# Patient Record
Sex: Male | Born: 1955
Health system: Southern US, Community
[De-identification: ages and names within clinical notes are randomized; demographics above are authoritative.]

## PROBLEM LIST (undated history)

## (undated) DIAGNOSIS — J358 Other chronic diseases of tonsils and adenoids: Secondary | ICD-10-CM

## (undated) DIAGNOSIS — E785 Hyperlipidemia, unspecified: Secondary | ICD-10-CM

## (undated) HISTORY — DX: Other chronic diseases of tonsils and adenoids: J35.8

## (undated) HISTORY — DX: Hyperlipidemia, unspecified: E78.5

## (undated) HISTORY — PX: VASECTOMY: SHX75

## (undated) HISTORY — PX: EYE SURGERY: SHX253

---

## 2006-08-08 ENCOUNTER — Ambulatory Visit: Payer: Self-pay | Admitting: Internal Medicine

## 2006-08-08 DIAGNOSIS — R1013 Epigastric pain: Secondary | ICD-10-CM

## 2006-08-08 DIAGNOSIS — G4700 Insomnia, unspecified: Secondary | ICD-10-CM | POA: Insufficient documentation

## 2006-08-08 DIAGNOSIS — K3189 Other diseases of stomach and duodenum: Secondary | ICD-10-CM | POA: Insufficient documentation

## 2006-08-08 LAB — CONVERTED CEMR LAB
ALT: 24 units/L (ref 0–40)
AST: 20 units/L (ref 0–37)
Albumin: 4.2 g/dL (ref 3.5–5.2)
Alkaline Phosphatase: 60 units/L (ref 39–117)
BUN: 9 mg/dL (ref 6–23)
Bilirubin, Direct: 0.1 mg/dL (ref 0.0–0.3)
CO2: 33 meq/L — ABNORMAL HIGH (ref 19–32)
Calcium: 9.3 mg/dL (ref 8.4–10.5)
Chloride: 105 meq/L (ref 96–112)
Creatinine, Ser: 0.8 mg/dL (ref 0.4–1.5)
GFR calc Af Amer: 132 mL/min
GFR calc non Af Amer: 109 mL/min
Glucose, Bld: 90 mg/dL (ref 70–99)
Potassium: 3.7 meq/L (ref 3.5–5.1)
Sodium: 142 meq/L (ref 135–145)
TSH: 2.47 microintl units/mL (ref 0.35–5.50)
Total Bilirubin: 0.9 mg/dL (ref 0.3–1.2)
Total Protein: 6.6 g/dL (ref 6.0–8.3)

## 2006-09-12 ENCOUNTER — Ambulatory Visit: Payer: Self-pay | Admitting: Internal Medicine

## 2007-06-13 ENCOUNTER — Ambulatory Visit: Payer: Self-pay | Admitting: Internal Medicine

## 2007-07-15 ENCOUNTER — Ambulatory Visit: Payer: Self-pay | Admitting: Internal Medicine

## 2008-06-18 ENCOUNTER — Ambulatory Visit: Payer: Self-pay | Admitting: Internal Medicine

## 2008-06-18 LAB — CONVERTED CEMR LAB
ALT: 22 units/L (ref 0–53)
AST: 21 units/L (ref 0–37)
Albumin: 4.1 g/dL (ref 3.5–5.2)
Alkaline Phosphatase: 51 units/L (ref 39–117)
BUN: 16 mg/dL (ref 6–23)
Basophils Relative: 0.6 % (ref 0.0–3.0)
Bilirubin Urine: NEGATIVE
Chloride: 106 meq/L (ref 96–112)
Cholesterol: 192 mg/dL (ref 0–200)
Creatinine, Ser: 0.9 mg/dL (ref 0.4–1.5)
Eosinophils Relative: 5.6 % — ABNORMAL HIGH (ref 0.0–5.0)
Hemoglobin, Urine: NEGATIVE
Hemoglobin: 15.4 g/dL (ref 13.0–17.0)
LDL Cholesterol: 133 mg/dL — ABNORMAL HIGH (ref 0–99)
Lymphocytes Relative: 27.5 % (ref 12.0–46.0)
MCHC: 35 g/dL (ref 30.0–36.0)
Monocytes Relative: 11 % (ref 3.0–12.0)
Neutro Abs: 2.5 10*3/uL (ref 1.4–7.7)
RBC: 4.61 M/uL (ref 4.22–5.81)
Total CHOL/HDL Ratio: 5
Total Protein, Urine: NEGATIVE mg/dL
Total Protein: 6.6 g/dL (ref 6.0–8.3)
Urine Glucose: NEGATIVE mg/dL
WBC: 4.6 10*3/uL (ref 4.5–10.5)
pH: 7.5 (ref 5.0–8.0)

## 2008-07-08 ENCOUNTER — Ambulatory Visit: Payer: Self-pay | Admitting: Internal Medicine

## 2008-08-11 ENCOUNTER — Ambulatory Visit: Payer: Self-pay | Admitting: Gastroenterology

## 2008-08-25 ENCOUNTER — Encounter: Payer: Self-pay | Admitting: Internal Medicine

## 2009-09-24 ENCOUNTER — Ambulatory Visit: Payer: Self-pay | Admitting: Internal Medicine

## 2009-09-24 LAB — CONVERTED CEMR LAB
Albumin: 4.2 g/dL (ref 3.5–5.2)
Alkaline Phosphatase: 62 units/L (ref 39–117)
Basophils Absolute: 0.1 10*3/uL (ref 0.0–0.1)
Bilirubin Urine: NEGATIVE
CO2: 34 meq/L — ABNORMAL HIGH (ref 19–32)
Calcium: 9.4 mg/dL (ref 8.4–10.5)
Chloride: 107 meq/L (ref 96–112)
Cholesterol: 205 mg/dL — ABNORMAL HIGH (ref 0–200)
Creatinine, Ser: 0.9 mg/dL (ref 0.4–1.5)
Eosinophils Absolute: 0.3 10*3/uL (ref 0.0–0.7)
Glucose, Bld: 87 mg/dL (ref 70–99)
HDL: 43.5 mg/dL (ref >39.00)
Hemoglobin, Urine: NEGATIVE
Hemoglobin: 15.6 g/dL (ref 13.0–17.0)
Ketones, ur: NEGATIVE mg/dL
Lymphocytes Relative: 30.6 % (ref 12.0–46.0)
MCHC: 34.6 g/dL (ref 30.0–36.0)
MCV: 97.7 fL (ref 78.0–100.0)
Monocytes Absolute: 0.5 10*3/uL (ref 0.1–1.0)
Neutro Abs: 2.7 10*3/uL (ref 1.4–7.7)
RDW: 13.2 % (ref 11.5–14.6)
TSH: 2.61 microintl units/mL (ref 0.35–5.50)
Total Protein, Urine: NEGATIVE mg/dL
Total Protein: 7 g/dL (ref 6.0–8.3)
Triglycerides: 129 mg/dL (ref 0.0–149.0)
Urine Glucose: NEGATIVE mg/dL
pH: 5.5 (ref 5.0–8.0)

## 2009-10-01 ENCOUNTER — Ambulatory Visit: Payer: Self-pay | Admitting: Internal Medicine

## 2010-04-13 NOTE — Assessment & Plan Note (Signed)
Summary: CPX//CCM   Vital Signs:  Patient profile:   55 year old male Height:      67.75 inches Weight:      150 pounds BMI:     23.06 Pulse rate:   64 / minute Pulse rhythm:   regular Resp:     12 per minute BP sitting:   106 / 76  (left arm) Cuff size:   regular  Vitals Entered By: Gladis Riffle, RN (October 01, 2009 8:12 AM) CC: cpx, labs done Is Patient Diabetic? No   CC:  cpx and labs done.  Preventive Screening-Counseling & Management  Alcohol-Tobacco     Smoking Status: never  Current Problems (verified): 1)  Preventive Health Care  (ICD-V70.0) 2)  Special Screening For Malignant Neoplasms Colon  (ICD-V76.51) 3)  Dyspepsia  (ICD-536.8) 4)  Insomnia  (ICD-780.52) 5)  Family History Breast Cancer 1st Degree Relative <50  (ICD-V16.3)  Current Medications (verified): 1)  None  Allergies (verified): No Known Drug Allergies  Past History:  Past Medical History: Last updated: 2006/09/04 Unremarkable  Past Surgical History: Last updated: 07/08/2008 Vasectomy colonoscopy (2005)  Family History: Last updated: 09/04/06 father deceased-hx of liver cancer died from MI age 4.  mother alive and well (breast CA) Family History Breast cancer 1st degree relative <50  Social History: Last updated: 06/13/2007 Regular exercise-yes-runs regularly moved from [facility] 7/07 Quit caffeine, no ETOH  Occupation:quality Art gallery manager for [employer] Married Patient never smoked.   Risk Factors: Exercise: yes (09/04/06)  Risk Factors: Smoking Status: never (10/01/2009)  Physical Exam  General:  alert and well-developed.   Head:  normocephalic and atraumatic.   Eyes:  pupils equal and pupils round.   Ears:  R ear normal and L ear normal.   Neck:  no JVD.  Thyroid not enlarged Lungs:  clear bilaterally to auscultation and percussion Heart:  normal rate and regular rhythm.   Abdomen:  Bowel sounds positive,abdomen soft and non-tender without masses, organomegaly or hernias  noted. Msk:  No deformity or scoliosis noted of thoracic or lumbar spine.   Extremities:  No clubbing, cyanosis, edema, or deformity noted  Neurologic:  cranial nerves II-XII intact and gait normal.     Impression & Recommendations:  Problem # 1:  PREVENTIVE HEALTH CARE (ICD-V70.0) health maint UTD encouraged continued excellent health habits cpx in two years  Preventive Care Screening  Colonoscopy:    Date:  08/25/2008    Next Due:  08/2018    Results:  normal     Preventive Care Screening  Colonoscopy:    Date:  08/25/2008    Next Due:  08/2018    Results:  normal

## 2011-10-16 ENCOUNTER — Other Ambulatory Visit (INDEPENDENT_AMBULATORY_CARE_PROVIDER_SITE_OTHER): Payer: BC Managed Care – PPO

## 2011-10-16 ENCOUNTER — Ambulatory Visit (INDEPENDENT_AMBULATORY_CARE_PROVIDER_SITE_OTHER): Payer: BC Managed Care – PPO | Admitting: Family Medicine

## 2011-10-16 ENCOUNTER — Encounter: Payer: Self-pay | Admitting: Family Medicine

## 2011-10-16 VITALS — BP 124/84 | Temp 97.7°F | Wt 150.0 lb

## 2011-10-16 DIAGNOSIS — Z0000 Encounter for general adult medical examination without abnormal findings: Secondary | ICD-10-CM

## 2011-10-16 DIAGNOSIS — L259 Unspecified contact dermatitis, unspecified cause: Secondary | ICD-10-CM

## 2011-10-16 LAB — CBC WITH DIFFERENTIAL/PLATELET
Basophils Absolute: 0.1 10*3/uL (ref 0.0–0.1)
HCT: 44.9 % (ref 39.0–52.0)
Hemoglobin: 14.9 g/dL (ref 13.0–17.0)
Lymphs Abs: 1.5 10*3/uL (ref 0.7–4.0)
MCV: 98.2 fl (ref 78.0–100.0)
Monocytes Absolute: 0.4 10*3/uL (ref 0.1–1.0)
Monocytes Relative: 9.6 % (ref 3.0–12.0)
Neutro Abs: 2.4 10*3/uL (ref 1.4–7.7)
Platelets: 233 10*3/uL (ref 150.0–400.0)
RDW: 13.1 % (ref 11.5–14.6)

## 2011-10-16 LAB — POCT URINALYSIS DIPSTICK
Bilirubin, UA: NEGATIVE
Ketones, UA: NEGATIVE
Leukocytes, UA: NEGATIVE
Spec Grav, UA: <=1.005

## 2011-10-16 LAB — BASIC METABOLIC PANEL
BUN: 14 mg/dL (ref 6–23)
CO2: 32 mEq/L (ref 19–32)
Chloride: 103 mEq/L (ref 96–112)
GFR: 95.6 mL/min (ref >60.00)
Glucose, Bld: 95 mg/dL (ref 70–99)
Potassium: 5.3 mEq/L — ABNORMAL HIGH (ref 3.5–5.1)
Sodium: 140 mEq/L (ref 135–145)

## 2011-10-16 LAB — LIPID PANEL: Cholesterol: 191 mg/dL (ref 0–200)

## 2011-10-16 LAB — HEPATIC FUNCTION PANEL
ALT: 28 U/L (ref 0–53)
AST: 24 U/L (ref 0–37)
Albumin: 4.2 g/dL (ref 3.5–5.2)
Total Bilirubin: 0.8 mg/dL (ref 0.3–1.2)

## 2011-10-16 LAB — PSA: PSA: 1.38 ng/mL (ref 0.10–4.00)

## 2011-10-16 LAB — TSH: TSH: 2.58 u[IU]/mL (ref 0.35–5.50)

## 2011-10-16 MED ORDER — PREDNISONE 10 MG PO TABS: ORAL_TABLET | ORAL | Status: DC

## 2011-10-16 MED ORDER — METHYLPREDNISOLONE ACETATE 80 MG/ML IJ SUSP
80.0000 mg | Freq: Once | INTRAMUSCULAR | Status: AC
  Administered 2011-10-16: 80 mg via INTRAMUSCULAR

## 2011-10-16 NOTE — Progress Notes (Signed)
  Subjective:    Patient ID: Ronnie Hawkins, male    DOB: 1955/04/09, 56 y.o.   MRN: [id number]  HPI  Pruritic rash mostly below left eye. Started last week. No visual difficulties. Has had similar issues with contact dermatitis previously. He has a little involvement neck, right hand, and right side of face as well. No alleviating factors.   Review of Systems  Constitutional: Negative for fever and chills.  Eyes: Negative for visual disturbance.  Skin: Positive for rash.       Objective:   Physical Exam  Constitutional: He appears well-developed and well-nourished.  Eyes: Pupils are equal, round, and reactive to light.  Cardiovascular: Normal rate and regular rhythm.   Skin:       Patient has rash compatible with contact dermatitis below the left eyelid. Slightly raised minimally erythematous. No cellulitis changes.          Assessment & Plan:  Contact dermatitis. Discussed options with patient. Depo-Medrol 80 mg IM given. Oral prednisone taper to take if he is not seeing resolution with Depo-Medrol. He has tolerated Depo-Medrol previously.

## 2011-10-16 NOTE — Patient Instructions (Addendum)

## 2011-10-23 ENCOUNTER — Ambulatory Visit (INDEPENDENT_AMBULATORY_CARE_PROVIDER_SITE_OTHER): Payer: BC Managed Care – PPO | Admitting: Internal Medicine

## 2011-10-23 ENCOUNTER — Encounter: Payer: Self-pay | Admitting: Internal Medicine

## 2011-10-23 VITALS — BP 112/80 | HR 60 | Temp 98.0°F | Ht 67.5 in | Wt 149.0 lb

## 2011-10-23 DIAGNOSIS — Z0000 Encounter for general adult medical examination without abnormal findings: Secondary | ICD-10-CM

## 2011-10-25 NOTE — Progress Notes (Signed)
Patient ID: Ronnie Hawkins, male   DOB: [birth year], 56 y.o.   MRN: [id number] cpx  History reviewed. No pertinent past medical history.  History   Social History  . Marital Status: Married    Spouse Name: N/A    Number of Children: N/A  . Years of Education: N/A   Occupational History  . Not on file.   Social History Main Topics  . Smoking status: Never Smoker   . Smokeless tobacco: Not on file  . Alcohol Use: No  . Drug Use: Not on file  . Sexually Active: Not on file   Other Topics Concern  . Not on file   Social History Narrative  . No narrative on file    History reviewed. No pertinent past surgical history.  Family History  Problem Relation Age of Onset  . Cancer Mother     breast CA  . Diabetes Mother   . Cancer Father 109    liver- alcohol related  . Heart disease Father 55    dm    No Known Allergies  No current outpatient prescriptions on file prior to visit.     patient denies chest pain, shortness of breath, orthopnea. Denies lower extremity edema, abdominal pain, change in appetite, change in bowel movements. Patient denies rashes, musculoskeletal complaints. No other specific complaints in a complete review of systems.   BP 112/80  Pulse 60  Temp 98 F (36.7 C) (Oral)  Ht 5' 7.5" (1.715 m)  Wt 149 lb (67.586 kg)  BMI 22.99 kg/m2 Well-developed male in no acute distress. HEENT exam atraumatic, normocephalic, extraocular muscles are intact. Conjunctivae are pink without exudate. Neck is supple without lymphadenopathy, thyromegaly, jugular venous distention. Chest is clear to auscultation without increased work of breathing. Cardiac exam S1-S2 are regular. The PMI is normal. No significant murmurs or gallops. Abdominal exam active bowel sounds, soft, nontender. No abdominal bruits. Extremities no clubbing cyanosis or edema. Peripheral pulses are normal without bruits. Neurologic exam alert and oriented without any motor or sensory deficits. Rectal exam  normal tone prostate normal size without masses or asymmetry.   A/P- well visit- health maint utd

## 2013-04-10 ENCOUNTER — Ambulatory Visit (INDEPENDENT_AMBULATORY_CARE_PROVIDER_SITE_OTHER): Payer: BC Managed Care – PPO | Admitting: Family Medicine

## 2013-04-10 ENCOUNTER — Encounter: Payer: Self-pay | Admitting: Family Medicine

## 2013-04-10 VITALS — BP 116/80 | HR 69 | Temp 97.9°F | Wt 154.0 lb

## 2013-04-10 DIAGNOSIS — J358 Other chronic diseases of tonsils and adenoids: Secondary | ICD-10-CM

## 2013-04-10 DIAGNOSIS — R220 Localized swelling, mass and lump, head: Secondary | ICD-10-CM

## 2013-04-10 DIAGNOSIS — J029 Acute pharyngitis, unspecified: Secondary | ICD-10-CM

## 2013-04-10 DIAGNOSIS — R221 Localized swelling, mass and lump, neck: Secondary | ICD-10-CM

## 2013-04-10 LAB — POCT RAPID STREP A (OFFICE): RAPID STREP A SCREEN: NEGATIVE

## 2013-04-10 NOTE — Patient Instructions (Signed)

## 2013-04-10 NOTE — Progress Notes (Signed)
Pre visit review using our clinic review tool, if applicable. No additional management support is needed unless otherwise documented below in the visit note. 

## 2013-04-10 NOTE — Progress Notes (Signed)
   Subjective:    Patient ID: Ronnie Hawkins, male    DOB: [birth year], 58 y.o.   MRN: [id number]  HPI Acute visit for sore throat, body aches, chest congestion. Onset a few days ago. No fever. Sore throat symptoms are mild to moderate. Cough is relatively mild and nonproductive. No wheezing. Minimal nasal congestion. He has had no nausea or vomiting. Good fluid intake. Not taking any over-the-counter medications.  He has no chronic medical problems. Takes no regular medications. No known drug allergies. Denies any recent difficulty swallowing.  No past medical history on file. No past surgical history on file.  reports that he has never smoked. He does not have any smokeless tobacco history on file. He reports that he does not drink alcohol. His drug history is not on file. family history includes Cancer in his mother; Cancer (age of onset: 27) in his father; Diabetes in his mother; Heart disease (age of onset: 38) in his father. No Known Allergies    Review of Systems  Constitutional: Negative for fever, chills, appetite change and unexpected weight change.  HENT: Positive for sore throat. Negative for ear pain, sinus pressure and trouble swallowing.   Respiratory: Positive for cough.   Hematological: Negative for adenopathy. Does not bruise/bleed easily.       Objective:   Physical Exam  Constitutional: He appears well-developed.  HENT:  Right Ear: External ear normal.  Left Ear: External ear normal.  Nose: Nose normal.  Posterior pharynx no erythema. No exudate. Left tonsillar region small polypoid type mass which is very rounded and symmetric and non-ulcerative  Neck: Neck supple.  Cardiovascular: Normal rate.   Pulmonary/Chest: Effort normal and breath sounds normal. No respiratory distress. He has no wheezes. He has no rales.  Lymphadenopathy:    He has no cervical adenopathy.          Assessment & Plan:  #1 viral URI. Reassurance. Over-the-counter medications as  needed #2 probable benign polypoid type mass left tonsillar region. He has not had any adenopathy or other worrisome features but will have ENT evaluation

## 2013-05-20 ENCOUNTER — Other Ambulatory Visit (INDEPENDENT_AMBULATORY_CARE_PROVIDER_SITE_OTHER): Payer: BC Managed Care – PPO

## 2013-05-20 DIAGNOSIS — Z0000 Encounter for general adult medical examination without abnormal findings: Secondary | ICD-10-CM

## 2013-05-20 LAB — CBC WITH DIFFERENTIAL/PLATELET
BASOS PCT: 0.6 % (ref 0.0–3.0)
Basophils Absolute: 0 10*3/uL (ref 0.0–0.1)
EOS PCT: 4.9 % (ref 0.0–5.0)
Eosinophils Absolute: 0.2 10*3/uL (ref 0.0–0.7)
HEMATOCRIT: 44.6 % (ref 39.0–52.0)
HEMOGLOBIN: 15 g/dL (ref 13.0–17.0)
LYMPHS ABS: 1.5 10*3/uL (ref 0.7–4.0)
Lymphocytes Relative: 30.8 % (ref 12.0–46.0)
MCHC: 33.7 g/dL (ref 30.0–36.0)
MCV: 96.6 fl (ref 78.0–100.0)
MONO ABS: 0.6 10*3/uL (ref 0.1–1.0)
Monocytes Relative: 11.7 % (ref 3.0–12.0)
NEUTROS ABS: 2.6 10*3/uL (ref 1.4–7.7)
Neutrophils Relative %: 52 % (ref 43.0–77.0)
Platelets: 258 10*3/uL (ref 150.0–400.0)
RBC: 4.61 Mil/uL (ref 4.22–5.81)
RDW: 13.2 % (ref 11.5–14.6)
WBC: 4.9 10*3/uL (ref 4.5–10.5)

## 2013-05-20 LAB — HEPATIC FUNCTION PANEL
ALBUMIN: 4.1 g/dL (ref 3.5–5.2)
ALT: 29 U/L (ref 0–53)
AST: 25 U/L (ref 0–37)
Alkaline Phosphatase: 63 U/L (ref 39–117)
BILIRUBIN DIRECT: 0.1 mg/dL (ref 0.0–0.3)
TOTAL PROTEIN: 6.7 g/dL (ref 6.0–8.3)
Total Bilirubin: 0.8 mg/dL (ref 0.3–1.2)

## 2013-05-20 LAB — POCT URINALYSIS DIPSTICK
BILIRUBIN UA: NEGATIVE
Blood, UA: NEGATIVE
Glucose, UA: NEGATIVE
Ketones, UA: NEGATIVE
LEUKOCYTES UA: NEGATIVE
NITRITE UA: NEGATIVE
Protein, UA: NEGATIVE
Spec Grav, UA: 1.025
UROBILINOGEN UA: 0.2
pH, UA: 5.5

## 2013-05-20 LAB — BASIC METABOLIC PANEL
BUN: 19 mg/dL (ref 6–23)
CHLORIDE: 103 meq/L (ref 96–112)
CO2: 32 meq/L (ref 19–32)
Calcium: 9.3 mg/dL (ref 8.4–10.5)
Creatinine, Ser: 0.9 mg/dL (ref 0.4–1.5)
GFR: 91.44 mL/min (ref >60.00)
Glucose, Bld: 93 mg/dL (ref 70–99)
POTASSIUM: 4.3 meq/L (ref 3.5–5.1)
Sodium: 140 mEq/L (ref 135–145)

## 2013-05-20 LAB — TSH: TSH: 2.79 u[IU]/mL (ref 0.35–5.50)

## 2013-05-20 LAB — PSA: PSA: 1.87 ng/mL (ref 0.10–4.00)

## 2013-05-20 LAB — LIPID PANEL
CHOLESTEROL: 204 mg/dL — AB (ref 0–200)
HDL: 43.6 mg/dL (ref >39.00)
LDL Cholesterol: 142 mg/dL — ABNORMAL HIGH (ref 0–99)
TRIGLYCERIDES: 90 mg/dL (ref 0.0–149.0)
Total CHOL/HDL Ratio: 5
VLDL: 18 mg/dL (ref 0.0–40.0)

## 2013-05-27 ENCOUNTER — Ambulatory Visit (INDEPENDENT_AMBULATORY_CARE_PROVIDER_SITE_OTHER): Payer: BC Managed Care – PPO | Admitting: Internal Medicine

## 2013-05-27 ENCOUNTER — Encounter: Payer: Self-pay | Admitting: Internal Medicine

## 2013-05-27 ENCOUNTER — Encounter: Payer: BC Managed Care – PPO | Admitting: Internal Medicine

## 2013-05-27 VITALS — BP 110/74 | HR 76 | Temp 98.0°F | Ht 68.0 in | Wt 151.0 lb

## 2013-05-27 DIAGNOSIS — Z0000 Encounter for general adult medical examination without abnormal findings: Secondary | ICD-10-CM

## 2013-05-27 NOTE — Progress Notes (Signed)
Pre visit review using our clinic review tool, if applicable. No additional management support is needed unless otherwise documented below in the visit note. 

## 2013-05-27 NOTE — Progress Notes (Signed)
cpx  No past medical history on file.  History   Social History  . Marital Status: Married    Spouse Name: N/A    Number of Children: N/A  . Years of Education: N/A   Occupational History  . Not on file.   Social History Main Topics  . Smoking status: Never Smoker   . Smokeless tobacco: Not on file  . Alcohol Use: No  . Drug Use: Not on file  . Sexual Activity: Not on file   Other Topics Concern  . Not on file   Social History Narrative  . No narrative on file    No past surgical history on file.  Family History  Problem Relation Age of Onset  . Cancer Mother     breast CA  . Diabetes Mother   . Cancer Father 11    liver- alcohol related  . Heart disease Father 55    dm    No Known Allergies  No current outpatient prescriptions on file prior to visit.   No current facility-administered medications on file prior to visit.     patient denies chest pain, shortness of breath, orthopnea. Denies lower extremity edema, abdominal pain, change in appetite, change in bowel movements. Patient denies rashes, musculoskeletal complaints. No other specific complaints in a complete review of systems.   BP 110/74  Pulse 76  Temp(Src) 98 F (36.7 C) (Oral)  Ht 5\' 8"  (1.727 m)  Wt 151 lb (68.493 kg)  BMI 22.96 kg/m2 Well-developed male in no acute distress. HEENT exam atraumatic, normocephalic, extraocular muscles are intact. Conjunctivae are pink without exudate. Neck is supple without lymphadenopathy, thyromegaly, jugular venous distention. Chest is clear to auscultation without increased work of breathing. Cardiac exam S1-S2 are regular. The PMI is normal. No significant murmurs or gallops. Abdominal exam active bowel sounds, soft, nontender. No abdominal bruits. Extremities no clubbing cyanosis or edema. Peripheral pulses are normal without bruits. Neurologic exam alert and oriented without any motor or sensory deficits. Rectal exam normal tone prostate normal size without  masses or asymmetry.  Well visit- health maint utd

## 2014-04-06 ENCOUNTER — Telehealth: Payer: Self-pay | Admitting: Internal Medicine

## 2014-04-10 ENCOUNTER — Telehealth: Payer: Self-pay | Admitting: Internal Medicine

## 2014-04-10 NOTE — Telephone Encounter (Signed)
Pt needs health examination certificate completed within next 2 wks. Can I create 30  Min slot? Pt also needs appt around 3 pm

## 2014-04-11 NOTE — Telephone Encounter (Signed)
Dr. Hunter see below 

## 2014-04-11 NOTE — Telephone Encounter (Signed)
Please let patient know that I would love to accommodate him but I already have limited availability for sick day care. If there happens to be a cancellation, please make him first priority for 30 minute slot.  From my glance, it does appear there is a 30 minute slot at 2:30 on the 11th. If this slot is still available, please squeeze him in there. Otherwise, would need to wait for cancellation.

## 2014-04-13 NOTE — Telephone Encounter (Signed)
lmom for pt to confirm appt 04-23-14 at 230 pm it a good time

## 2014-04-14 NOTE — Telephone Encounter (Signed)
lmom for pt to confirm 04-23-14 is good

## 2014-04-15 NOTE — Telephone Encounter (Signed)
Pt has confirm appt.

## 2014-04-23 ENCOUNTER — Encounter: Payer: Self-pay | Admitting: Family Medicine

## 2014-04-23 ENCOUNTER — Ambulatory Visit (INDEPENDENT_AMBULATORY_CARE_PROVIDER_SITE_OTHER): Payer: BLUE CROSS/BLUE SHIELD | Admitting: Family Medicine

## 2014-04-23 VITALS — BP 120/70 | Temp 98.2°F | Wt 152.0 lb

## 2014-04-23 DIAGNOSIS — Z0289 Encounter for other administrative examinations: Secondary | ICD-10-CM

## 2014-04-23 DIAGNOSIS — E785 Hyperlipidemia, unspecified: Secondary | ICD-10-CM

## 2014-04-23 HISTORY — DX: Hyperlipidemia, unspecified: E78.5

## 2014-04-23 NOTE — Patient Instructions (Addendum)
Come back to have Tb test injected around 4pm on Monday and would need to be read either Wednesday between 4-5pm or Thursday before you received the injection. Ask the front to put you on [place] schedule  If you have a normal Tb skin test,then we will give you your completed form on follow up.   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

## 2014-04-23 NOTE — Progress Notes (Signed)
  Ronnie Reddish, MD Phone: [phone]  Subjective:  Patient presents today to establish care with me as their new primary care provider. Patient was formerly a patient of Dr. Leanne Chang. Chief complaint-noted.   Hyperlipidemia-mild poor control not on medication  Lab Results  Component Value Date   LDLCALC 142* 05/20/2013  10 year risk at 6.8% 10 year risk On statin: no Regular exercise: yes, Runner about 20-25 miles a week.  Diet: healthy ROS- no chest pain or shortness of breath. No myalgias  Patient also requests form completion for [employer]. Full history reviewed to assess risk  The following were reviewed and entered/updated in epic: Past Medical History  Diagnosis Date  . Tonsillar cyst     Eval by Dr. Lucia Gaskins of ENT  . Hyperlipidemia 04/23/2014   Patient Active Problem List   Diagnosis Date Noted  . Hyperlipidemia 04/23/2014    Priority: Medium   Past Surgical History  Procedure Laterality Date  . Vasectomy      Family History  Problem Relation Age of Onset  . Cancer Mother     breast CA  . Diabetes Mother   . Cancer Father 45    liver- alcohol related  . Heart attack Father 58  . Diabetes Father   . Diabetes Brother     Medications- reviewed and updated No current outpatient prescriptions on file.   No current facility-administered medications for this visit.    Allergies-reviewed and updated No Known Allergies  History   Social History  . Marital Status: Married    Spouse Name: N/A  . Number of Children: N/A  . Years of Education: N/A   Social History Main Topics  . Smoking status: Never Smoker   . Smokeless tobacco: Not on file  . Alcohol Use: No  . Drug Use: No  . Sexual Activity: Not on file   Other Topics Concern  . None   Social History Narrative   Married. 2 sons (grown). No grandkids yet. No pets.       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 Middle school math.       Hobbies: running and reading     ROS--See HPI   Objective: BP 120/70 mmHg  Temp(Src) 98.2 F (36.8 C)  Wt 152 lb (68.947 kg) Gen: NAD, resting comfortably, wears glasses HEENT: Mucous membranes are moist. Oropharynx normal. Good dentition.  CV: RRR no murmurs rubs or gallops Lungs: CTAB no crackles, wheeze, rhonchi Abdomen: soft/nontender/nondistended/normal bowel sounds.  Ext: no edema Skin: warm, dry Neuro: grossly normal, moves all extremities, PERRLA   Assessment/Plan:  Hyperlipidemia Mild poor control. 10 year risk at 6.8% I suspect risk lower given great CV health due to running. I discussed with patient considering 15% cutoff as long as still running due to risks of statins. He is in agreement for primary prevention.    Form filled out, awaiting TB test which patient will return Monday to complete -see avs.   Return precautions advised. Follows up for CPE in next few months.

## 2014-04-23 NOTE — Assessment & Plan Note (Signed)
Mild poor control. 10 year risk at 6.8% I suspect risk lower given great CV health due to running. I discussed with patient considering 15% cutoff as long as still running due to risks of statins. He is in agreement for primary prevention.

## 2014-04-28 ENCOUNTER — Telehealth: Payer: Self-pay | Admitting: Internal Medicine

## 2014-04-28 NOTE — Telephone Encounter (Signed)
Dr Yong Channel advise the pt that he need a tb test. The patient can not come in until after 3 pm . May I put him in at the 11:30 injection slot but put in the comment that he will be in after 3pm on Wednesday 05/06/14

## 2014-04-29 NOTE — Telephone Encounter (Signed)
Pt scheduled  

## 2014-04-29 NOTE — Telephone Encounter (Signed)
Yes that's fine 

## 2014-05-06 ENCOUNTER — Telehealth (INDEPENDENT_AMBULATORY_CARE_PROVIDER_SITE_OTHER): Payer: BLUE CROSS/BLUE SHIELD

## 2014-05-06 ENCOUNTER — Ambulatory Visit: Payer: BLUE CROSS/BLUE SHIELD | Admitting: Family Medicine

## 2014-05-06 DIAGNOSIS — Z111 Encounter for screening for respiratory tuberculosis: Secondary | ICD-10-CM

## 2014-05-06 NOTE — Telephone Encounter (Signed)
Ppd placed

## 2014-05-08 LAB — TB SKIN TEST
INDURATION: 0 mm
TB SKIN TEST: NEGATIVE

## 2014-06-04 ENCOUNTER — Telehealth: Payer: Self-pay | Admitting: Internal Medicine

## 2014-06-04 ENCOUNTER — Encounter: Payer: Self-pay | Admitting: Family Medicine

## 2014-06-09 ENCOUNTER — Ambulatory Visit: Payer: BLUE CROSS/BLUE SHIELD | Admitting: Family Medicine

## 2014-06-09 NOTE — Telephone Encounter (Signed)
See below

## 2014-06-09 NOTE — Telephone Encounter (Signed)
Yes you may schedule him at his convenience

## 2014-06-09 NOTE — Telephone Encounter (Signed)
Pt is out of town and cannot get a flight back.  Met w/ you in Feb to get a form filled out. Marland Kitchen Pt thought this was his physical, but it states OV transfer.  Is it ok to resc his appt for a cpe later in the summer? Otherwise, you are in to next year for a transfer appt/.

## 2014-09-04 NOTE — Telephone Encounter (Signed)
Pt has been scheduled.  °

## 2014-10-23 ENCOUNTER — Other Ambulatory Visit: Payer: BLUE CROSS/BLUE SHIELD

## 2014-10-30 ENCOUNTER — Encounter: Payer: BLUE CROSS/BLUE SHIELD | Admitting: Family Medicine

## 2014-12-10 ENCOUNTER — Ambulatory Visit (INDEPENDENT_AMBULATORY_CARE_PROVIDER_SITE_OTHER): Payer: Commercial Managed Care - PPO | Admitting: Family Medicine

## 2014-12-10 ENCOUNTER — Other Ambulatory Visit (INDEPENDENT_AMBULATORY_CARE_PROVIDER_SITE_OTHER): Payer: Commercial Managed Care - PPO

## 2014-12-10 DIAGNOSIS — Z0000 Encounter for general adult medical examination without abnormal findings: Secondary | ICD-10-CM

## 2014-12-10 DIAGNOSIS — Z23 Encounter for immunization: Secondary | ICD-10-CM | POA: Diagnosis not present

## 2014-12-10 LAB — POCT URINALYSIS DIPSTICK
BILIRUBIN UA: NEGATIVE
GLUCOSE UA: NEGATIVE
Ketones, UA: NEGATIVE
Leukocytes, UA: NEGATIVE
Nitrite, UA: NEGATIVE
Protein, UA: NEGATIVE
RBC UA: NEGATIVE
SPEC GRAV UA: 1.015
UROBILINOGEN UA: 0.2
pH, UA: 7

## 2014-12-10 LAB — PSA: PSA: 2.33 ng/mL (ref 0.10–4.00)

## 2014-12-10 LAB — BASIC METABOLIC PANEL
BUN: 14 mg/dL (ref 6–23)
CO2: 33 meq/L — AB (ref 19–32)
Calcium: 9.5 mg/dL (ref 8.4–10.5)
Chloride: 102 mEq/L (ref 96–112)
Creatinine, Ser: 0.85 mg/dL (ref 0.40–1.50)
GFR: 98.38 mL/min (ref >60.00)
Glucose, Bld: 88 mg/dL (ref 70–99)
Potassium: 4.4 mEq/L (ref 3.5–5.1)
SODIUM: 141 meq/L (ref 135–145)

## 2014-12-10 LAB — HEPATIC FUNCTION PANEL
ALK PHOS: 47 U/L (ref 39–117)
ALT: 18 U/L (ref 0–53)
AST: 16 U/L (ref 0–37)
Albumin: 4.4 g/dL (ref 3.5–5.2)
BILIRUBIN DIRECT: 0.1 mg/dL (ref 0.0–0.3)
BILIRUBIN TOTAL: 0.7 mg/dL (ref 0.2–1.2)
Total Protein: 6.6 g/dL (ref 6.0–8.3)

## 2014-12-10 LAB — CBC WITH DIFFERENTIAL/PLATELET
BASOS ABS: 0.1 10*3/uL (ref 0.0–0.1)
BASOS PCT: 1.2 % (ref 0.0–3.0)
EOS ABS: 0.2 10*3/uL (ref 0.0–0.7)
Eosinophils Relative: 4.1 % (ref 0.0–5.0)
HCT: 44.5 % (ref 39.0–52.0)
HEMOGLOBIN: 14.9 g/dL (ref 13.0–17.0)
Lymphocytes Relative: 23.7 % (ref 12.0–46.0)
Lymphs Abs: 1.3 10*3/uL (ref 0.7–4.0)
MCHC: 33.4 g/dL (ref 30.0–36.0)
MCV: 96.5 fl (ref 78.0–100.0)
MONO ABS: 0.4 10*3/uL (ref 0.1–1.0)
Monocytes Relative: 7.5 % (ref 3.0–12.0)
Neutro Abs: 3.6 10*3/uL (ref 1.4–7.7)
Neutrophils Relative %: 63.5 % (ref 43.0–77.0)
Platelets: 228 10*3/uL (ref 150.0–400.0)
RBC: 4.61 Mil/uL (ref 4.22–5.81)
RDW: 13.3 % (ref 11.5–15.5)
WBC: 5.7 10*3/uL (ref 4.0–10.5)

## 2014-12-10 LAB — LIPID PANEL
CHOL/HDL RATIO: 4
Cholesterol: 183 mg/dL (ref 0–200)
HDL: 50.7 mg/dL (ref >39.00)
LDL CALC: 113 mg/dL — AB (ref 0–99)
NonHDL: 131.8
TRIGLYCERIDES: 95 mg/dL (ref 0.0–149.0)
VLDL: 19 mg/dL (ref 0.0–40.0)

## 2014-12-10 LAB — TSH: TSH: 2.32 u[IU]/mL (ref 0.35–4.50)

## 2014-12-11 ENCOUNTER — Other Ambulatory Visit: Payer: BLUE CROSS/BLUE SHIELD

## 2014-12-18 ENCOUNTER — Encounter: Payer: Self-pay | Admitting: Family Medicine

## 2014-12-18 ENCOUNTER — Ambulatory Visit (INDEPENDENT_AMBULATORY_CARE_PROVIDER_SITE_OTHER): Payer: Commercial Managed Care - PPO | Admitting: Family Medicine

## 2014-12-18 VITALS — BP 120/80 | HR 68 | Temp 98.3°F | Ht 68.0 in | Wt 144.0 lb

## 2014-12-18 DIAGNOSIS — Z0000 Encounter for general adult medical examination without abnormal findings: Secondary | ICD-10-CM | POA: Diagnosis not present

## 2014-12-18 DIAGNOSIS — E785 Hyperlipidemia, unspecified: Secondary | ICD-10-CM

## 2014-12-18 DIAGNOSIS — R972 Elevated prostate specific antigen [PSA]: Secondary | ICD-10-CM

## 2014-12-18 NOTE — Progress Notes (Signed)
Ronnie Reddish, MD Phone: [phone]  Subjective:  Patient presents today for their annual physical. Chief complaint-noted.   -overall doing well. 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 Running regularly but slightly less than before -15 miles per week ROS- full  review of systems was completed and negative   The following were reviewed and entered/updated in epic: Past Medical History  Diagnosis Date  . Tonsillar cyst     Eval by Dr. Lucia Gaskins of ENT  . Hyperlipidemia 04/23/2014   Patient Active Problem List   Diagnosis Date Noted  . Hyperlipidemia 04/23/2014    Priority: Medium   Past Surgical History  Procedure Laterality Date  . Vasectomy      Family History  Problem Relation Age of Onset  . Cancer Mother     breast CA  . Diabetes Mother   . Cancer Father 54    liver- alcohol related  . Heart attack Father 70  . Diabetes Father   . Diabetes Brother     Medications- reviewed and updated Current Outpatient Prescriptions  Medication Sig Dispense Refill  . aspirin 81 MG tablet Take 81 mg by mouth daily.     No current facility-administered medications for this visit.    Allergies-reviewed and updated No Known Allergies  Social History   Social History  . Marital Status: Married    Spouse Name: N/A  . Number of Children: N/A  . Years of Education: N/A   Social History Main Topics  . Smoking status: Never Smoker   . Smokeless tobacco: None  . Alcohol Use: No  . Drug Use: No  . Sexual Activity: Not Asked   Other Topics Concern  . None   Social History Narrative   Married. 2 sons (grown). No grandkids yet. No pets.       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 [employer]- [employer].       Hobbies: running and reading     ROS--See HPI   Objective: BP 120/80 mmHg  Pulse 68  Temp(Src) 98.3 F (36.8 C)  Ht 5\' 8"  (1.727 m)  Wt 144 lb (65.318 kg)  BMI 21.90 kg/m2 Gen: NAD,  resting comfortably HEENT: Mucous membranes are moist. Oropharynx normal Neck: no thyromegaly CV: RRR no murmurs rubs or gallops Lungs: CTAB no crackles, wheeze, rhonchi Abdomen: soft/nontender/nondistended/normal bowel sounds. No rebound or guarding.  Rectal: normal tone, normal size prostate, no masses or tenderness Ext: no edema Skin: warm, dry, no rash- no obvious AK, SCC, BCC, melanoma Neuro: grossly normal, moves all extremities, PERRLA  Assessment/Plan:  59 y.o. male presenting for annual physical.  Health Maintenance counseling: 1. Anticipatory guidance: Patient counseled regarding regular dental exams, wearing seatbelts, wearing sunscreen, eye doctor visits 2. Risk factor reduction:  Advised patient of need for regular exercise and diet rich and fruits and vegetables to reduce risk of heart attack and stroke. 15 miles a week running currently.  3. Immunizations/screenings/ancillary studies Health Maintenance Due  Topic Date Due  . Hepatitis C Screening - declined 07-31-1955  . HIV Screening - declined 11/20/1971  4. Prostate cancer screening- PSA trending up, repeat 6 months, rectal low risk today  Lab Results  Component Value Date   PSA 2.33 12/10/2014   PSA 1.87 05/20/2013   PSA 1.38 10/16/2011  5. Colon cancer screening - 03/13/2006, 10 year repeat 6. Skin cancer screening- does not see derm, full skin exam  Hyperlipidemia 10 year risk  5.9%. Continue without statin. With dad's history MI in 61s though was obese, smoker, alcohol user- would start if above 7.5%   future Orders Placed This Encounter  Procedures  . PSA    Standing Status: Future     Number of Occurrences:      Standing Expiration Date: 12/18/2015   Meds ordered this encounter  Medications  . aspirin 81 MG tablet    Sig: Take 81 mg by mouth daily.

## 2014-12-18 NOTE — Assessment & Plan Note (Signed)
10 year risk 5.9%. Continue without statin. With dad's history MI in 33s though was obese, smoker, alcohol user- would start if above 7.5%

## 2014-12-18 NOTE — Patient Instructions (Signed)
No changes today  Cholesterol low enough that we will not start medicine at that time. If your risk gets above 7.5% for 10 year risk heart attack or stroke we would start at that time potentially  Follow up in 6 months for repeat PSA  Otherwise see me in 1 year or if you need me

## 2015-01-26 NOTE — Telephone Encounter (Signed)
error 

## 2015-06-18 ENCOUNTER — Other Ambulatory Visit (INDEPENDENT_AMBULATORY_CARE_PROVIDER_SITE_OTHER): Payer: Commercial Managed Care - PPO

## 2015-06-18 DIAGNOSIS — R972 Elevated prostate specific antigen [PSA]: Secondary | ICD-10-CM

## 2015-06-18 LAB — PSA: PSA: 2.66 ng/mL (ref 0.10–4.00)

## 2015-08-20 ENCOUNTER — Telehealth: Payer: Self-pay | Admitting: Family Medicine

## 2015-08-20 NOTE — Telephone Encounter (Signed)
Please see message. °

## 2015-08-20 NOTE — Telephone Encounter (Signed)
I do not know Dr. Smitty Pluck. July is reasonable as far as waiting for biopsy.

## 2015-08-20 NOTE — Telephone Encounter (Signed)
Pt said he can not get into [facility] until July and is asking if you know Dr Marry Guan who is a Dealer in [place]

## 2015-08-23 NOTE — Telephone Encounter (Signed)
Spoke with patient. He stated he recently learned [facility] is out of his insurance plan so he is working on alternative plans to be covered in [place]. Instructed patient to let us know if we could be of assistance. Verbalized understanding.

## 2015-11-08 ENCOUNTER — Other Ambulatory Visit: Payer: Self-pay

## 2015-11-08 DIAGNOSIS — C61 Malignant neoplasm of prostate: Secondary | ICD-10-CM

## 2015-11-16 ENCOUNTER — Other Ambulatory Visit: Payer: Commercial Managed Care - PPO

## 2015-11-22 ENCOUNTER — Other Ambulatory Visit (INDEPENDENT_AMBULATORY_CARE_PROVIDER_SITE_OTHER): Payer: Commercial Managed Care - PPO

## 2015-11-22 DIAGNOSIS — C61 Malignant neoplasm of prostate: Secondary | ICD-10-CM

## 2015-11-23 LAB — PSA: PSA: 0.04 ng/mL — AB (ref 0.10–4.00)

## 2016-01-17 ENCOUNTER — Telehealth: Payer: Self-pay | Admitting: Family Medicine

## 2016-01-17 NOTE — Telephone Encounter (Signed)
Pt called to schedule his PSA / 3 mo lab. However., no orders in the system.  Can you put the order in? Then pt asked results faxed to Dr. Clydell Hakim office per patient request at [phone]  I scheduled the lab already, OK?

## 2016-01-18 ENCOUNTER — Other Ambulatory Visit: Payer: Self-pay

## 2016-01-18 DIAGNOSIS — R972 Elevated prostate specific antigen [PSA]: Secondary | ICD-10-CM

## 2016-01-18 NOTE — Telephone Encounter (Signed)
Called and left a voicemail message asking for a return phone call to schedule a follow up appointment

## 2016-01-18 NOTE — Telephone Encounter (Signed)
Spoke with pateint and scheduled him for a physical on 03/02/2016

## 2016-01-18 NOTE — Telephone Encounter (Signed)
Yes thanks, you may order but he needs to schedule follow up with me

## 2016-02-21 ENCOUNTER — Other Ambulatory Visit (INDEPENDENT_AMBULATORY_CARE_PROVIDER_SITE_OTHER): Payer: Commercial Managed Care - PPO

## 2016-02-21 DIAGNOSIS — R972 Elevated prostate specific antigen [PSA]: Secondary | ICD-10-CM

## 2016-02-21 DIAGNOSIS — Z0000 Encounter for general adult medical examination without abnormal findings: Secondary | ICD-10-CM

## 2016-02-22 LAB — HEPATIC FUNCTION PANEL
ALK PHOS: 58 U/L (ref 39–117)
ALT: 21 U/L (ref 0–53)
AST: 20 U/L (ref 0–37)
Albumin: 4.5 g/dL (ref 3.5–5.2)
BILIRUBIN DIRECT: 0.1 mg/dL (ref 0.0–0.3)
BILIRUBIN TOTAL: 0.6 mg/dL (ref 0.2–1.2)
Total Protein: 6.6 g/dL (ref 6.0–8.3)

## 2016-02-22 LAB — POC URINALSYSI DIPSTICK (AUTOMATED)
Bilirubin, UA: NEGATIVE
Blood, UA: NEGATIVE
GLUCOSE UA: NEGATIVE
KETONES UA: NEGATIVE
Leukocytes, UA: NEGATIVE
Nitrite, UA: NEGATIVE
Protein, UA: NEGATIVE
SPEC GRAV UA: 1.015
Urobilinogen, UA: 0.2
pH, UA: 6.5

## 2016-02-22 LAB — PSA: PSA: 0 ng/mL — ABNORMAL LOW (ref 0.10–4.00)

## 2016-02-22 LAB — CBC WITH DIFFERENTIAL/PLATELET
BASOS ABS: 0 10*3/uL (ref 0.0–0.1)
BASOS PCT: 0.8 % (ref 0.0–3.0)
EOS ABS: 0.4 10*3/uL (ref 0.0–0.7)
Eosinophils Relative: 5.8 % — ABNORMAL HIGH (ref 0.0–5.0)
HCT: 45.1 % (ref 39.0–52.0)
HEMOGLOBIN: 15.3 g/dL (ref 13.0–17.0)
Lymphocytes Relative: 24.6 % (ref 12.0–46.0)
Lymphs Abs: 1.5 10*3/uL (ref 0.7–4.0)
MCHC: 33.9 g/dL (ref 30.0–36.0)
MCV: 95.3 fl (ref 78.0–100.0)
MONO ABS: 0.5 10*3/uL (ref 0.1–1.0)
Monocytes Relative: 8 % (ref 3.0–12.0)
Neutro Abs: 3.7 10*3/uL (ref 1.4–7.7)
Neutrophils Relative %: 60.8 % (ref 43.0–77.0)
PLATELETS: 262 10*3/uL (ref 150.0–400.0)
RBC: 4.73 Mil/uL (ref 4.22–5.81)
RDW: 13.2 % (ref 11.5–15.5)
WBC: 6.1 10*3/uL (ref 4.0–10.5)

## 2016-02-22 LAB — BASIC METABOLIC PANEL
BUN: 13 mg/dL (ref 6–23)
CALCIUM: 9.6 mg/dL (ref 8.4–10.5)
CHLORIDE: 102 meq/L (ref 96–112)
CO2: 32 mEq/L (ref 19–32)
CREATININE: 0.87 mg/dL (ref 0.40–1.50)
GFR: 95.38 mL/min (ref >60.00)
Glucose, Bld: 93 mg/dL (ref 70–99)
Potassium: 4.1 mEq/L (ref 3.5–5.1)
Sodium: 141 mEq/L (ref 135–145)

## 2016-02-22 LAB — LIPID PANEL
CHOL/HDL RATIO: 4
Cholesterol: 197 mg/dL (ref 0–200)
HDL: 49.9 mg/dL (ref >39.00)
LDL CALC: 126 mg/dL — AB (ref 0–99)
NONHDL: 147.53
Triglycerides: 110 mg/dL (ref 0.0–149.0)
VLDL: 22 mg/dL (ref 0.0–40.0)

## 2016-02-22 LAB — TSH: TSH: 2.31 u[IU]/mL (ref 0.35–4.50)

## 2016-03-02 ENCOUNTER — Encounter: Payer: Self-pay | Admitting: Family Medicine

## 2016-03-02 ENCOUNTER — Ambulatory Visit (INDEPENDENT_AMBULATORY_CARE_PROVIDER_SITE_OTHER): Payer: Commercial Managed Care - PPO | Admitting: Family Medicine

## 2016-03-02 VITALS — BP 116/74 | HR 65 | Temp 98.0°F | Ht 67.32 in | Wt 149.4 lb

## 2016-03-02 DIAGNOSIS — Z8546 Personal history of malignant neoplasm of prostate: Secondary | ICD-10-CM | POA: Insufficient documentation

## 2016-03-02 DIAGNOSIS — Z0000 Encounter for general adult medical examination without abnormal findings: Secondary | ICD-10-CM

## 2016-03-02 DIAGNOSIS — Z1211 Encounter for screening for malignant neoplasm of colon: Secondary | ICD-10-CM | POA: Diagnosis not present

## 2016-03-02 NOTE — Progress Notes (Signed)
Phone: [phone]  Subjective:  Patient presents today for their annual physical. Chief complaint-noted.   See problem oriented charting- ROS- full  review of systems was completed and negative except for: mild incontinence issues after prostatectomy now improving  The following were reviewed and entered/updated in epic: Past Medical History:  Diagnosis Date  . Hyperlipidemia 04/23/2014  . Tonsillar cyst    Eval by Dr. Lucia Gaskins of ENT   Patient Active Problem List   Diagnosis Date Noted  . Hyperlipidemia 04/23/2014    Priority: Medium  . History of prostate cancer 03/02/2016    Priority: Low   Past Surgical History:  Procedure Laterality Date  . VASECTOMY      Family History  Problem Relation Age of Onset  . Cancer Mother     breast CA  . Diabetes Mother   . Cancer Father 58    liver- alcohol related  . Heart attack Father 83  . Diabetes Father   . Diabetes Brother     Medications- reviewed and updated Current Outpatient Prescriptions  Medication Sig Dispense Refill  . aspirin 81 MG tablet Take 81 mg by mouth daily.     No current facility-administered medications for this visit.     Allergies-reviewed and updated No Known Allergies  Social History   Social History  . Marital status: Married    Spouse name: N/A  . Number of children: N/A  . Years of education: N/A   Social History Main Topics  . Smoking status: Never Smoker  . Smokeless tobacco: None  . Alcohol use No  . Drug use: No  . Sexual activity: Not Asked   Other Topics Concern  . None   Social History Narrative   Married. 2 sons (grown). No grandkids yet. No pets.       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 [employer]- [employer].       Hobbies: running and reading     Objective: BP 116/74 (BP Location: Left Arm, Patient Position: Sitting, Cuff Size: Normal)   Pulse 65   Temp 98 F (36.7 C) (Oral)   Ht 5' 7.32" (1.71 m)   Wt  149 lb 6.4 oz (67.8 kg)   SpO2 95%   BMI 23.18 kg/m  Gen: NAD, resting comfortably HEENT: Mucous membranes are moist. Oropharynx normal Neck: no thyromegaly CV: RRR no murmurs rubs or gallops Lungs: CTAB no crackles, wheeze, rhonchi Abdomen: soft/nontender/nondistended/normal bowel sounds. No rebound or guarding.  Ext: no edema Skin: warm, dry Neuro: grossly normal, moves all extremities, PERRLA Rectal deferred- no prostate  Assessment/Plan:  60 y.o. male presenting for annual physical.  Health Maintenance counseling: 1. Anticipatory guidance: Patient counseled regarding regular dental exams, eye exams, wearing seatbelts.  2. Risk factor reduction:  Advised patient of need for regular exercise and diet rich and fruits and vegetables to reduce risk of heart attack and stroke. Exercising regularly, reasonable diet.  3. Immunizations/screenings/ancillary studies Immunization History  Administered Date(s) Administered  . Influenza,inj,Quad PF,36+ Mos 12/10/2014  . Influenza-Unspecified 12/11/2012, 12/29/2013, 12/13/2015  . PPD Test 05/06/2014  . Td 07/08/2008    4. Prostate cancer screening- prostatectomy 10/22/15. Followed by urology Lab Results  Component Value Date   PSA 0.00 (L) 02/22/2016   PSA 0.04 (L) 11/22/2015   PSA 2.66 06/18/2015   5. Colon cancer screening - 03/13/2006, refer at this time  6. Skin cancer screening- does not see dermatology  Status of chronic or  acute concerns   HLD- mild 6.6% 10 year risk- focus on veggies in diet half plate lunch and dinner- reassess 1 year Dad with history MI in 59s but was obese, smoker, alcohol user  Frozen shoulder- [facility]. Now resolved. Thought related to fall with running but didn't really line up in the end. MRI shoulder- held off on injection.   Return in about 1 year (around 03/02/2017) for physical.  Orders Placed This Encounter  Procedures  . Ambulatory referral to Gastroenterology    Referral Priority:    Routine    Referral Type:   Consultation    Referral Reason:   Specialty Services Required    Number of Visits Requested:   1   Return precautions advised.   Garret Reddish, MD

## 2016-03-02 NOTE — Progress Notes (Signed)
Pre visit review using our clinic review tool, if applicable. No additional management support is needed unless otherwise documented below in the visit note. 

## 2016-03-02 NOTE — Patient Instructions (Addendum)
We will call you within a week about your referral to GI for colonoscopy. If you do not hear within 2 weeks, give [place] a call.   Otherwise things look great- keep up your activity and healthy eating! Try to make 1/2 your plate veggies at lunch and dinner  See you in a year. Have a Happy Holiday season! And hope the books are good to you

## 2016-04-03 ENCOUNTER — Encounter: Payer: Self-pay | Admitting: Family Medicine

## 2016-05-29 ENCOUNTER — Other Ambulatory Visit (INDEPENDENT_AMBULATORY_CARE_PROVIDER_SITE_OTHER): Payer: Commercial Managed Care - PPO

## 2016-05-29 ENCOUNTER — Other Ambulatory Visit: Payer: Self-pay | Admitting: *Deleted

## 2016-05-29 DIAGNOSIS — C61 Malignant neoplasm of prostate: Secondary | ICD-10-CM

## 2016-05-30 LAB — PSA: PSA: 0 ng/mL — ABNORMAL LOW (ref 0.10–4.00)

## 2016-08-22 ENCOUNTER — Ambulatory Visit: Payer: Self-pay | Admitting: Family Medicine

## 2016-08-23 ENCOUNTER — Encounter: Payer: Self-pay | Admitting: Family Medicine

## 2016-08-23 ENCOUNTER — Ambulatory Visit (INDEPENDENT_AMBULATORY_CARE_PROVIDER_SITE_OTHER): Payer: Commercial Managed Care - PPO | Admitting: Family Medicine

## 2016-08-23 VITALS — BP 118/80 | HR 69 | Temp 98.2°F | Ht 67.2 in | Wt 148.0 lb

## 2016-08-23 DIAGNOSIS — Z1283 Encounter for screening for malignant neoplasm of skin: Secondary | ICD-10-CM

## 2016-08-23 DIAGNOSIS — H6122 Impacted cerumen, left ear: Secondary | ICD-10-CM | POA: Diagnosis not present

## 2016-08-23 DIAGNOSIS — Z8546 Personal history of malignant neoplasm of prostate: Secondary | ICD-10-CM

## 2016-08-23 DIAGNOSIS — Z7184 Encounter for health counseling related to travel: Secondary | ICD-10-CM

## 2016-08-23 DIAGNOSIS — Z7189 Other specified counseling: Secondary | ICD-10-CM

## 2016-08-23 LAB — PSA: PSA: 0.01 ng/mL — ABNORMAL LOW (ref 0.10–4.00)

## 2016-08-23 NOTE — Assessment & Plan Note (Signed)
S: patient with robotic prostatectomy on 10/22/15. PSA reached appropriate nadir of 0 as of march.  Lab Results  Component Value Date   PSA 0.00 (L) 05/29/2016   PSA 0.00 (L) 02/22/2016   PSA 0.04 (L) 11/22/2015  A/P: wants psa updated due to urology request. Done today and will be faxed to [facility]

## 2016-08-23 NOTE — Progress Notes (Signed)
Subjective:  Ronnie Hawkins is a 61 y.o. year old very pleasant male patient who presents for/with See problem oriented charting ROS- No chest pain or shortness of breath. No headache or blurry vision. Does have some left ear fullness   Past Medical History-  Patient Active Problem List   Diagnosis Date Noted  . Hyperlipidemia 04/23/2014    Priority: Medium  . History of prostate cancer 03/02/2016    Priority: Low    Medications- reviewed and updated Current Outpatient Prescriptions  Medication Sig Dispense Refill  . aspirin 81 MG tablet Take 81 mg by mouth daily.    . Multiple Vitamin (MULTI-VITAMINS) TABS Take 1 tablet by mouth daily.     No current facility-administered medications for this visit.     Objective: BP 118/80 (BP Location: Left Arm, Patient Position: Sitting, Cuff Size: Large)   Pulse 69   Temp 98.2 F (36.8 C) (Oral)   Ht 5' 7.2" (1.707 m)   Wt 148 lb (67.1 kg)   SpO2 97%   BMI 23.04 kg/m  Gen: NAD, resting comfortably, Well-appearing Cerumen impaction, left ear CV: RRR no murmurs rubs or gallops Lungs: CTAB no crackles, wheeze, rhonchi Abdomen: Thin Ext: no edema Skin: warm, dry. Flesh-colored 1 mm nodule on right side of nose  Assessment/Plan:  History of prostate cancer S: patient with robotic prostatectomy on 10/22/15. PSA reached appropriate nadir of 0 as of march.  Lab Results  Component Value Date   PSA 0.00 (L) 05/29/2016   PSA 0.00 (L) 02/22/2016   PSA 0.04 (L) 11/22/2015  A/P: wants psa updated due to urology request. Done today and will be faxed to [facility]  We also discussed that patient reports He had colonoscopy in June 2010. We discussed need to get this on the record. He will request a release of information today  Patient is traveling to [place] for 3 weeks- chaperoning HS for a week, then visiting family. Only has one day where he will not be in a major city. He got on the [facility] website. He decided to get a typhoid vaccine at the [facility]. We discussed at least starting hepatitis A vaccination series which he declines. He will consider with future travel. He believes he had hepatitis B immunizations though we do not have records here. Would prefer him to see travel medicine clinic but it is too late at this point. We discussed other recommendations on CDC website which are limited by time or availability in our clinic  Finally patient asks about skin cancer screening. We had a discussion about options and ultimately he decided he wanted to be referred to dermatology which was done today.  Finally we discussed option of irrigation for cerumen impaction. He declines and we discussed home therapy with hydrogen peroxide or mineral oil  Orders Placed This Encounter  Procedures  . PSA  . Ambulatory referral to Dermatology    Referral Priority:   Routine    Referral Type:   Consultation    Referral Reason:   Specialty Services Required    Requested Specialty:   Dermatology    Number of Visits Requested:   1    Meds ordered this encounter  Medications  . Multiple Vitamin (MULTI-VITAMINS) TABS    Sig: Take 1 tablet by mouth daily.   The duration of face-to-face time during this visit was greater than 20 minutes. Greater than 50% of this time was spent in counseling, explanation of diagnosis, planning of further management, and/or coordination  of care including skin cancer screening, travel advice, home care for cerumen impaction.  Return precautions advised.  Garret Reddish, MD

## 2016-08-23 NOTE — Patient Instructions (Addendum)
We will call you within a week or two about your referral to dermatology. If you do not hear within 3 weeks, give [place] a call.   Sign release of information at the check out desk for last colonoscopy report  Trial hydrogen peroxide 3-4 drops 3-4x a day for the wax in ear for 30 seconds  Please stop by lab before you go  Have a great trip!

## 2016-09-15 DIAGNOSIS — C61 Malignant neoplasm of prostate: Secondary | ICD-10-CM | POA: Diagnosis not present

## 2016-12-03 ENCOUNTER — Encounter: Payer: Self-pay | Admitting: Family Medicine

## 2017-02-23 ENCOUNTER — Other Ambulatory Visit: Payer: Self-pay

## 2017-02-23 ENCOUNTER — Other Ambulatory Visit (INDEPENDENT_AMBULATORY_CARE_PROVIDER_SITE_OTHER): Payer: Commercial Managed Care - PPO

## 2017-02-23 DIAGNOSIS — C61 Malignant neoplasm of prostate: Secondary | ICD-10-CM

## 2017-02-24 ENCOUNTER — Encounter: Payer: Self-pay | Admitting: Family Medicine

## 2017-02-24 LAB — PSA

## 2017-03-09 DIAGNOSIS — C61 Malignant neoplasm of prostate: Secondary | ICD-10-CM | POA: Diagnosis not present

## 2017-03-16 ENCOUNTER — Telehealth: Payer: Self-pay | Admitting: Family Medicine

## 2017-03-16 NOTE — Telephone Encounter (Signed)
Please call patient to advise if needed. There was no other note to get additional information from.   Copied from [place] [id number]. Topic: Inquiry >> Mar 15, 2017  5:05 PM Corie Chiquito, [place] wrote: Reason for CRM: Patient  returned call that he had missed. If someone from the office could give him a call back at [phone]

## 2017-06-08 ENCOUNTER — Encounter: Payer: Self-pay | Admitting: Family Medicine

## 2017-06-08 ENCOUNTER — Ambulatory Visit (INDEPENDENT_AMBULATORY_CARE_PROVIDER_SITE_OTHER): Payer: Commercial Managed Care - PPO | Admitting: Family Medicine

## 2017-06-08 VITALS — BP 124/86 | HR 63 | Temp 97.5°F | Ht 67.25 in | Wt 149.8 lb

## 2017-06-08 DIAGNOSIS — E785 Hyperlipidemia, unspecified: Secondary | ICD-10-CM

## 2017-06-08 DIAGNOSIS — Z0000 Encounter for general adult medical examination without abnormal findings: Secondary | ICD-10-CM

## 2017-06-08 DIAGNOSIS — Z8546 Personal history of malignant neoplasm of prostate: Secondary | ICD-10-CM

## 2017-06-08 DIAGNOSIS — Z1283 Encounter for screening for malignant neoplasm of skin: Secondary | ICD-10-CM | POA: Diagnosis not present

## 2017-06-08 LAB — COMPREHENSIVE METABOLIC PANEL
ALT: 19 U/L (ref 0–53)
AST: 20 U/L (ref 0–37)
Albumin: 4.2 g/dL (ref 3.5–5.2)
Alkaline Phosphatase: 63 U/L (ref 39–117)
BILIRUBIN TOTAL: 0.8 mg/dL (ref 0.2–1.2)
BUN: 15 mg/dL (ref 6–23)
CALCIUM: 9.5 mg/dL (ref 8.4–10.5)
CO2: 33 mEq/L — ABNORMAL HIGH (ref 19–32)
Chloride: 101 mEq/L (ref 96–112)
Creatinine, Ser: 0.91 mg/dL (ref 0.40–1.50)
GFR: 90.16 mL/min (ref >60.00)
GLUCOSE: 95 mg/dL (ref 70–99)
POTASSIUM: 5 meq/L (ref 3.5–5.1)
Sodium: 138 mEq/L (ref 135–145)
Total Protein: 6.8 g/dL (ref 6.0–8.3)

## 2017-06-08 LAB — LIPID PANEL
CHOL/HDL RATIO: 4
Cholesterol: 182 mg/dL (ref 0–200)
HDL: 47.9 mg/dL (ref >39.00)
LDL Cholesterol: 121 mg/dL — ABNORMAL HIGH (ref 0–99)
NONHDL: 133.69
TRIGLYCERIDES: 64 mg/dL (ref 0.0–149.0)
VLDL: 12.8 mg/dL (ref 0.0–40.0)

## 2017-06-08 LAB — CBC
HEMATOCRIT: 46.3 % (ref 39.0–52.0)
HEMOGLOBIN: 15.8 g/dL (ref 13.0–17.0)
MCHC: 34 g/dL (ref 30.0–36.0)
MCV: 96.6 fl (ref 78.0–100.0)
PLATELETS: 264 10*3/uL (ref 150.0–400.0)
RBC: 4.8 Mil/uL (ref 4.22–5.81)
RDW: 12.8 % (ref 11.5–15.5)
WBC: 4.8 10*3/uL (ref 4.0–10.5)

## 2017-06-08 LAB — PSA: PSA: 0.01 ng/mL — ABNORMAL LOW (ref 0.10–4.00)

## 2017-06-08 NOTE — Patient Instructions (Addendum)
We will call you within a week or two about your referral to dermatology. If you do not hear within 3 weeks, give [place] a call.   Donalda Ewings will send in your biometric form once we get your results back  As long as 10 year risk of heart attack or stroke is below 10%- lets keep you off aspirin and cholesterol medicine  Please stop by lab before you go

## 2017-06-08 NOTE — Progress Notes (Addendum)
Phone: [phone]  Subjective:  Patient presents today for their annual physical. Chief complaint-noted.   See problem oriented charting- ROS- full  review of systems was completed and negative including No chest pain or shortness of breath. No headache or blurry vision.   The following were reviewed and entered/updated in epic: Past Medical History:  Diagnosis Date  . Hyperlipidemia 04/23/2014  . Tonsillar cyst    Eval by Dr. Lucia Gaskins of ENT   Patient Active Problem List   Diagnosis Date Noted  . Hyperlipidemia 04/23/2014    Priority: Medium  . History of prostate cancer 03/02/2016    Priority: Low   Past Surgical History:  Procedure Laterality Date  . VASECTOMY      Family History  Problem Relation Age of Onset  . Cancer Mother        breast CA  . Diabetes Mother   . Cancer Father 25       liver- alcohol related  . Heart attack Father 67  . Diabetes Father   . Diabetes Brother     Medications- reviewed and updated Current Outpatient Medications  Medication Sig Dispense Refill  . Multiple Vitamin (MULTI-VITAMINS) TABS Take 1 tablet by mouth daily.     No current facility-administered medications for this visit.     Allergies-reviewed and updated No Known Allergies  Social History   Social History Narrative   Married. 2 sons (grown). No grandkids yet. No pets.       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       Hobbies: running and reading     Objective: BP 124/86 (BP Location: Left Arm, Patient Position: Sitting, Cuff Size: Normal)   Pulse 63   Temp (!) 97.5 F (36.4 C) (Oral)   Ht 5' 7.25" (1.708 m)   Wt 149 lb 12.8 oz (67.9 kg)   SpO2 94%   BMI 23.29 kg/m  Gen: NAD, resting comfortably HEENT: Mucous membranes are moist. Oropharynx normal Neck: no thyromegaly CV: RRR no murmurs rubs or gallops Lungs: CTAB no crackles, wheeze, rhonchi Abdomen:  soft/nontender/nondistended/normal bowel sounds. No rebound or guarding. Healthy weight Ext: no edema Skin: warm, dry Neuro: grossly normal, moves all extremities, PERRLA No rectal exam with prostatectomy  Assessment/Plan:  62 y.o. male presenting for annual physical.  Health Maintenance counseling: 1. Anticipatory guidance: Patient counseled regarding regular dental exams -q6 months, eye exams -yearly, wearing seatbelts.  2. Risk factor reduction:  Advised patient of need for regular exercise and diet rich and fruits and vegetables to reduce risk of heart attack and stroke. Exercise- he does well with this. Diet-reasonably healthy diet- does think he could increase green veggies.  Wt Readings from Last 3 Encounters:  06/08/17 149 lb 12.8 oz (67.9 kg)  08/23/16 148 lb (67.1 kg)  03/02/16 149 lb 6.4 oz (67.8 kg)  3. Immunizations/screenings/ancillary studies- discussed shingrix at pharmacy option Immunization History  Administered Date(s) Administered  . Influenza,inj,Quad PF,6+ Mos 12/10/2014  . Influenza-Unspecified 12/11/2012, 12/29/2013, 12/13/2015, 12/28/2016  . PPD Test 05/06/2014  . Td 07/08/2008  4. Prostate cancer screening-  [facility] with [facility]rology after robotic prostatectomy- has done very well with PSAs as below . He will need another PSA in June. We will do another PSA today and hoping for more detailed result than <0.1 Lab Results  Component Value Date   PSA <0.1 02/23/2017   PSA 0.01 (L) 08/23/2016   PSA 0.00 (L)  05/29/2016   5. Colon cancer screening 08/25/08 with 10 year repeat 6. Skin cancer screening- would like referral to dermatology- will do today. advised regular sunscreen use. Denies worrisome, changing, or new skin lesions.   Status of chronic or acute concerns   Hyperlipidemia- 10 year risk max 6.6% in past- update lipids. Consider statin if risk >10% and lifestyle maximized. He is now off asa.   Sometimes in the middle of the night he will wake up  kicking. Sporadic/occasional. Like he is in a dream- he wakes up and knows he was in a dream- usually defending himself. Discussed benzodiazepine option but will hold off given how intermittent symptoms are.   Notes a mild tremor in the right hand with holding coffee at times or cup of milk. Slow shake. Going on for a few years. Also strained bicep about 3 months ago - tremor predated this though  More gas in abdomen since coming back from [place] as well as tightness. . No weight loss. No nausea or vomiting. He was warned about intestinal parasites. No abnormal fatigue. Not eating more than normal. Discussed trial probiotic- he may try activia for 3-4 week trial.   1 year CPE  Lab/Order associations: Screening exam for skin cancer - Plan: Ambulatory referral to Dermatology  History of prostate cancer - Plan: PSA  Hyperlipidemia, unspecified hyperlipidemia type - Plan: CBC, Comprehensive metabolic panel, Lipid panel  Return precautions advised.  Garret Reddish, MD

## 2017-06-09 ENCOUNTER — Encounter: Payer: Self-pay | Admitting: Family Medicine

## 2017-06-26 DIAGNOSIS — H43811 Vitreous degeneration, right eye: Secondary | ICD-10-CM | POA: Diagnosis not present

## 2017-06-26 DIAGNOSIS — H538 Other visual disturbances: Secondary | ICD-10-CM | POA: Diagnosis not present

## 2017-06-26 DIAGNOSIS — H2513 Age-related nuclear cataract, bilateral: Secondary | ICD-10-CM | POA: Diagnosis not present

## 2017-07-11 DIAGNOSIS — H43811 Vitreous degeneration, right eye: Secondary | ICD-10-CM | POA: Diagnosis not present

## 2017-07-11 DIAGNOSIS — H2513 Age-related nuclear cataract, bilateral: Secondary | ICD-10-CM | POA: Diagnosis not present

## 2017-07-11 DIAGNOSIS — H5319 Other subjective visual disturbances: Secondary | ICD-10-CM | POA: Diagnosis not present

## 2017-07-12 DIAGNOSIS — H43813 Vitreous degeneration, bilateral: Secondary | ICD-10-CM | POA: Diagnosis not present

## 2017-07-12 DIAGNOSIS — H43393 Other vitreous opacities, bilateral: Secondary | ICD-10-CM | POA: Diagnosis not present

## 2017-07-12 DIAGNOSIS — H35412 Lattice degeneration of retina, left eye: Secondary | ICD-10-CM | POA: Diagnosis not present

## 2017-07-12 DIAGNOSIS — H33312 Horseshoe tear of retina without detachment, left eye: Secondary | ICD-10-CM | POA: Diagnosis not present

## 2017-08-21 ENCOUNTER — Other Ambulatory Visit: Payer: Self-pay

## 2017-08-21 ENCOUNTER — Other Ambulatory Visit (INDEPENDENT_AMBULATORY_CARE_PROVIDER_SITE_OTHER): Payer: Commercial Managed Care - PPO

## 2017-08-21 DIAGNOSIS — Z125 Encounter for screening for malignant neoplasm of prostate: Secondary | ICD-10-CM

## 2017-08-21 LAB — PSA: PSA: 0.04 ng/mL — AB (ref 0.10–4.00)

## 2017-08-22 ENCOUNTER — Telehealth: Payer: Self-pay | Admitting: Family Medicine

## 2017-08-22 NOTE — Telephone Encounter (Signed)
See note.   Copied from [place] [id number]. Topic: Inquiry >> Aug 22, 2017  3:52 PM Pricilla Handler wrote: Reason for CRM: Patient called wanting to know if his PSA lab results have been faxed to his Urologist. Please call the patient today at [phone].       Thank You!!!

## 2017-08-23 DIAGNOSIS — L821 Other seborrheic keratosis: Secondary | ICD-10-CM | POA: Diagnosis not present

## 2017-08-23 DIAGNOSIS — D2239 Melanocytic nevi of other parts of face: Secondary | ICD-10-CM | POA: Diagnosis not present

## 2017-08-23 DIAGNOSIS — D485 Neoplasm of uncertain behavior of skin: Secondary | ICD-10-CM | POA: Diagnosis not present

## 2017-08-23 DIAGNOSIS — L739 Follicular disorder, unspecified: Secondary | ICD-10-CM | POA: Diagnosis not present

## 2017-08-23 NOTE — Telephone Encounter (Signed)
Called patient and informed him I had faxed over his PSA results and received the confirmation that it went threw. Patient verbalized understanding.

## 2017-09-14 DIAGNOSIS — Z8042 Family history of malignant neoplasm of prostate: Secondary | ICD-10-CM | POA: Diagnosis not present

## 2017-09-14 DIAGNOSIS — Z8546 Personal history of malignant neoplasm of prostate: Secondary | ICD-10-CM | POA: Diagnosis not present

## 2017-09-14 DIAGNOSIS — Z803 Family history of malignant neoplasm of breast: Secondary | ICD-10-CM | POA: Diagnosis not present

## 2017-10-02 DIAGNOSIS — Z1211 Encounter for screening for malignant neoplasm of colon: Secondary | ICD-10-CM | POA: Diagnosis not present

## 2017-10-02 LAB — HM COLONOSCOPY

## 2017-11-13 ENCOUNTER — Encounter: Payer: Self-pay | Admitting: Family Medicine

## 2018-01-02 DIAGNOSIS — Z23 Encounter for immunization: Secondary | ICD-10-CM | POA: Diagnosis not present

## 2018-02-04 ENCOUNTER — Other Ambulatory Visit (INDEPENDENT_AMBULATORY_CARE_PROVIDER_SITE_OTHER): Payer: Commercial Managed Care - PPO

## 2018-02-04 ENCOUNTER — Other Ambulatory Visit: Payer: Self-pay

## 2018-02-04 DIAGNOSIS — Z8546 Personal history of malignant neoplasm of prostate: Secondary | ICD-10-CM

## 2018-02-04 LAB — PSA: PSA: 0.02 ng/mL — ABNORMAL LOW (ref 0.10–4.00)

## 2018-05-14 ENCOUNTER — Encounter (HOSPITAL_COMMUNITY): Payer: Self-pay

## 2018-05-14 ENCOUNTER — Emergency Department (HOSPITAL_COMMUNITY)
Admission: EM | Admit: 2018-05-14 | Discharge: 2018-05-15 | Disposition: A | Discharge status: Home / Self Care | Payer: Commercial Managed Care - PPO | Attending: Emergency Medicine | Admitting: Emergency Medicine

## 2018-05-14 ENCOUNTER — Other Ambulatory Visit: Payer: Self-pay

## 2018-05-14 DIAGNOSIS — Z79899 Other long term (current) drug therapy: Secondary | ICD-10-CM | POA: Insufficient documentation

## 2018-05-14 DIAGNOSIS — R42 Dizziness and giddiness: Secondary | ICD-10-CM | POA: Insufficient documentation

## 2018-05-14 DIAGNOSIS — Z8546 Personal history of malignant neoplasm of prostate: Secondary | ICD-10-CM | POA: Diagnosis not present

## 2018-05-14 DIAGNOSIS — R110 Nausea: Secondary | ICD-10-CM | POA: Insufficient documentation

## 2018-05-14 LAB — BASIC METABOLIC PANEL
Anion gap: 8 (ref 5–15)
BUN: 15 mg/dL (ref 8–23)
CO2: 25 mmol/L (ref 22–32)
Calcium: 8.9 mg/dL (ref 8.9–10.3)
Chloride: 102 mmol/L (ref 98–111)
Creatinine, Ser: 0.87 mg/dL (ref 0.61–1.24)
GFR calc non Af Amer: >60 mL/min (ref >60)
Glucose, Bld: 127 mg/dL — ABNORMAL HIGH (ref 70–99)
Potassium: 4 mmol/L (ref 3.5–5.1)
Sodium: 135 mmol/L (ref 135–145)

## 2018-05-14 LAB — URINALYSIS, ROUTINE W REFLEX MICROSCOPIC
Bilirubin Urine: NEGATIVE
GLUCOSE, UA: 150 mg/dL — AB
Hgb urine dipstick: NEGATIVE
Ketones, ur: 20 mg/dL — AB
Leukocytes,Ua: NEGATIVE
Nitrite: NEGATIVE
PROTEIN: NEGATIVE mg/dL
Specific Gravity, Urine: 1.026 (ref 1.005–1.030)
pH: 8 (ref 5.0–8.0)

## 2018-05-14 LAB — CBC
HCT: 46 % (ref 39.0–52.0)
Hemoglobin: 15.5 g/dL (ref 13.0–17.0)
MCH: 32.6 pg (ref 26.0–34.0)
MCHC: 33.7 g/dL (ref 30.0–36.0)
MCV: 96.8 fL (ref 80.0–100.0)
Platelets: 243 10*3/uL (ref 150–400)
RBC: 4.75 MIL/uL (ref 4.22–5.81)
RDW: 11.9 % (ref 11.5–15.5)
WBC: 10.3 10*3/uL (ref 4.0–10.5)
nRBC: 0 % (ref 0.0–0.2)

## 2018-05-14 LAB — CBG MONITORING, ED: Glucose-Capillary: 113 mg/dL — ABNORMAL HIGH (ref 70–99)

## 2018-05-14 MED ORDER — ONDANSETRON 4 MG PO TBDP
4.0000 mg | ORAL_TABLET | Freq: Once | ORAL | Status: AC | PRN
  Administered 2018-05-14: 4 mg via ORAL
  Filled 2018-05-14: qty 1

## 2018-05-14 MED ORDER — SODIUM CHLORIDE 0.9% FLUSH: 3.0000 mL | Freq: Once | INTRAVENOUS | Status: DC

## 2018-05-14 NOTE — ED Triage Notes (Signed)
Pt reports sudden onset dizziness and nausea 2 hours ago. Denies any weakness, no facial droop or slurred speech noted. Denies headache or blurred vision. Pt a.o, nad noted.

## 2018-05-15 ENCOUNTER — Encounter: Payer: Self-pay | Admitting: Neurology

## 2018-05-15 DIAGNOSIS — Z8546 Personal history of malignant neoplasm of prostate: Secondary | ICD-10-CM | POA: Diagnosis not present

## 2018-05-15 DIAGNOSIS — R42 Dizziness and giddiness: Secondary | ICD-10-CM | POA: Diagnosis not present

## 2018-05-15 DIAGNOSIS — R110 Nausea: Secondary | ICD-10-CM | POA: Diagnosis not present

## 2018-05-15 MED ORDER — MECLIZINE HCL 25 MG PO TABS: 25.0000 mg | ORAL_TABLET | Freq: Three times a day (TID) | ORAL | 0 refills | Status: DC | PRN

## 2018-05-15 MED ORDER — MECLIZINE HCL 25 MG PO TABS
25.0000 mg | ORAL_TABLET | Freq: Once | ORAL | Status: AC
  Administered 2018-05-15: 25 mg via ORAL
  Filled 2018-05-15: qty 1

## 2018-05-15 NOTE — ED Notes (Signed)
Patient verbalizes understanding of discharge instructions. Opportunity for questioning and answers were provided. Armband removed by staff, pt discharged from ED ambulatory.   

## 2018-05-15 NOTE — ED Provider Notes (Signed)
[facility] Provider Note   CSN: [id number] Arrival date & time: 05/14/18  2022    History   Chief Complaint Chief Complaint  Patient presents with  . Dizziness  . Nausea    HPI Ronnie Hawkins is a 63 y.o. male.     63 yo M with a chief complaint of dizziness.  Patient states that he was looking at a mirror to try and look at his back and suddenly felt unsteady.  States he laid down but it did not completely go away.  Began to feel significantly nauseated.  Denies vomiting.  Denies headache or neck pain.  His wife arrived and they decided to come to the hospital.  Since he has been waiting in the waiting room he feels that his dizziness is gotten significantly better.  Felt this was worse upon standing.  Better with laying back flat.  Denies worsening with head movement.  Denies recent URI.  Denies unilateral numbness or weakness denies difficulty with speech or swallowing  The history is provided by the patient.  Dizziness  Associated symptoms: no chest pain, no diarrhea, no headaches, no palpitations, no shortness of breath and no vomiting   Illness  Severity:  Mild Onset quality:  Gradual Duration:  8 hours Timing:  Rare Progression:  Partially resolved Chronicity:  New Associated symptoms: no abdominal pain, no chest pain, no congestion, no diarrhea, no fever, no headaches, no myalgias, no rash, no shortness of breath and no vomiting     Past Medical History:  Diagnosis Date  . Hyperlipidemia 04/23/2014  . Tonsillar cyst    Eval by Dr. Lucia Gaskins of ENT    Patient Active Problem List   Diagnosis Date Noted  . History of prostate cancer 03/02/2016  . Hyperlipidemia 04/23/2014    Past Surgical History:  Procedure Laterality Date  . VASECTOMY          Home Medications    Prior to Admission medications   Medication Sig Start Date End Date Taking? Authorizing Provider  meclizine (ANTIVERT) 25 MG tablet Take 1 tablet (25 mg  total) by mouth 3 (three) times daily as needed for dizziness. 05/15/18   Deno Etienne, DO  Multiple Vitamin (MULTI-VITAMINS) TABS Take 1 tablet by mouth daily.    [provider]    Family History Family History  Problem Relation Age of Onset  . Cancer Mother        breast CA  . Diabetes Mother   . Cancer Father 53       liver- alcohol related  . Heart attack Father 71  . Diabetes Father   . Diabetes Brother     Social History Social History   Tobacco Use  . Smoking status: Never Smoker  . Smokeless tobacco: Never Used  Substance Use Topics  . Alcohol use: No    Alcohol/week: 0.0 standard drinks  . Drug use: No     Allergies   Patient has no known allergies.   Review of Systems Review of Systems  Constitutional: Negative for chills and fever.  HENT: Negative for congestion and facial swelling.   Eyes: Negative for discharge and visual disturbance.  Respiratory: Negative for shortness of breath.   Cardiovascular: Negative for chest pain and palpitations.  Gastrointestinal: Negative for abdominal pain, diarrhea and vomiting.  Musculoskeletal: Negative for arthralgias and myalgias.  Skin: Negative for color change and rash.  Neurological: Positive for dizziness. Negative for tremors, syncope and headaches.  Psychiatric/Behavioral: Negative for  confusion and dysphoric mood.     Physical Exam Updated Vital Signs BP (!) 133/91   Pulse (!) 58   Temp (!) 97.4 F (36.3 C) (Oral)   Resp 16   SpO2 99%   Physical Exam Vitals signs and nursing note reviewed.  Constitutional:      Appearance: He is well-developed.  HENT:     Head: Normocephalic and atraumatic.     Comments: No appreciable nasal congestion, small effusion to the right TM.  Left TM is obscured by wax. Eyes:     Pupils: Pupils are equal, round, and reactive to light.  Neck:     Musculoskeletal: Normal range of motion and neck supple.     Vascular: No JVD.  Cardiovascular:     Rate and  Rhythm: Normal rate and regular rhythm.     Heart sounds: No murmur. No friction rub. No gallop.   Pulmonary:     Effort: No respiratory distress.     Breath sounds: No wheezing.  Abdominal:     General: There is no distension.     Tenderness: There is no guarding or rebound.  Musculoskeletal: Normal range of motion.  Skin:    Coloration: Skin is not pale.     Findings: No rash.  Neurological:     Mental Status: He is alert and oriented to person, place, and time.     GCS: GCS eye subscore is 4. GCS verbal subscore is 5. GCS motor subscore is 6.     Cranial Nerves: Cranial nerves are intact.     Sensory: Sensation is intact.     Motor: Motor function is intact.     Coordination: Coordination is intact. Romberg sign negative.     Gait: Gait is intact.     Comments: No noted nystagmus  Psychiatric:        Behavior: Behavior normal.      ED Treatments / Results  Labs (all labs ordered are listed, but only abnormal results are displayed) Labs Reviewed  BASIC METABOLIC PANEL - Abnormal; Notable for the following components:      Result Value   Glucose, Bld 127 (*)    All other components within normal limits  URINALYSIS, ROUTINE W REFLEX MICROSCOPIC - Abnormal; Notable for the following components:   APPearance HAZY (*)    Glucose, UA 150 (*)    Ketones, ur 20 (*)    All other components within normal limits  CBG MONITORING, ED - Abnormal; Notable for the following components:   Glucose-Capillary 113 (*)    All other components within normal limits  CBC    EKG EKG Interpretation  Date/Time:  Wednesday May 15 2018 03:25:43 EST Ventricular Rate:  52 PR Interval:    QRS Duration: 113 QT Interval:  444 QTC Calculation: 413 R Axis:   85 Text Interpretation:  Sinus rhythm Incomplete right bundle branch block Borderline ST elevation, lateral leads No old tracing to compare Confirmed by Deno Etienne [phone]) on 05/15/2018 3:27:25 AM   Radiology No results  found.  Procedures Procedures (including critical care time)  Medications Ordered in ED Medications  sodium chloride flush (NS) 0.9 % injection 3 mL (has no administration in time range)  ondansetron (ZOFRAN-ODT) disintegrating tablet 4 mg (4 mg Oral Given 05/14/18 2034)  meclizine (ANTIVERT) tablet 25 mg (25 mg Oral Given 05/15/18 6789)     Initial Impression / Assessment and Plan / ED Course  I have reviewed the triage vital signs and the nursing  notes.  Pertinent labs & imaging results that were available during my care of the patient were reviewed by me and considered in my medical decision making (see chart for details).        [place] with a chief complaint of dizziness.  States this feels like an unsteadiness happened suddenly while he was looking at a hand mirror that he was holding to look at his back.  Now is significantly better.  Never had issues with ambulation with it.  Neuro exam for me is completely benign.  I wonder if this is dehydration versus BPPV based on history with head positioning.  I discussed the possibility that he had a posterior circulation stroke versus TIA, discussed risks and benefits of MRI imaging at this time.  As he feels better the patient is electing to be followed up as an outpatient, will give her referral to neurology will have him follow-up with his family doctor.  4:16 AM:  I have discussed the diagnosis/risks/treatment options with the patient and believe the pt to be eligible for discharge home to follow-up with PCP, neuro. We also discussed returning to the ED immediately if new or worsening sx occur. We discussed the sx which are most concerning (e.g., sudden worsening pain, fever, inability to tolerate by mouth stroke s/sx) that necessitate immediate return. Medications administered to the patient during their visit and any new prescriptions provided to the patient are listed below.  Medications given during this visit Medications  sodium  chloride flush (NS) 0.9 % injection 3 mL (has no administration in time range)  ondansetron (ZOFRAN-ODT) disintegrating tablet 4 mg (4 mg Oral Given 05/14/18 2034)  meclizine (ANTIVERT) tablet 25 mg (25 mg Oral Given 05/15/18 1696)     The patient appears reasonably screen and/or stabilized for discharge and I doubt any other medical condition or other [facility] requiring further screening, evaluation, or treatment in the ED at this time prior to discharge.    Final Clinical Impressions(s) / ED Diagnoses   Final diagnoses:  Dizziness    ED Discharge Orders         Ordered    meclizine (ANTIVERT) 25 MG tablet  3 times daily PRN     05/15/18 0328    Ambulatory referral to Neurology    Comments:  Vertigo?   05/15/18 [place], [place], DO 05/15/18 [phone]

## 2018-05-15 NOTE — Discharge Instructions (Signed)
Return for worsening dizziness, inability to walk, unilateral numbness or weakness.  Follow up with your family doc.  Eat and drink well for the next couple of days.

## 2018-05-16 ENCOUNTER — Encounter: Payer: Self-pay | Admitting: Family Medicine

## 2018-05-16 ENCOUNTER — Ambulatory Visit (INDEPENDENT_AMBULATORY_CARE_PROVIDER_SITE_OTHER): Payer: Commercial Managed Care - PPO | Admitting: Family Medicine

## 2018-05-16 VITALS — BP 110/70 | HR 67 | Temp 97.9°F | Ht 67.25 in | Wt 153.5 lb

## 2018-05-16 DIAGNOSIS — E785 Hyperlipidemia, unspecified: Secondary | ICD-10-CM

## 2018-05-16 DIAGNOSIS — Z8546 Personal history of malignant neoplasm of prostate: Secondary | ICD-10-CM

## 2018-05-16 DIAGNOSIS — R42 Dizziness and giddiness: Secondary | ICD-10-CM

## 2018-05-16 NOTE — Progress Notes (Signed)
Phone [phone]   Subjective:  Ronnie Hawkins is a 63 y.o. year old very pleasant male patient who presents for/with See problem oriented charting ROS-  No facial or extremity weakness. No slurred words or trouble swallowing. no blurry vision or double vision. No paresthesias. No confusion or word finding difficulties. No seizure like activity.   Past Medical History-  Patient Active Problem List   Diagnosis Date Noted  . Hyperlipidemia 04/23/2014    Priority: Medium  . History of prostate cancer 03/02/2016    Priority: Low    Medications- reviewed and updated Current Outpatient Medications  Medication Sig Dispense Refill  . meclizine (ANTIVERT) 25 MG tablet Take 1 tablet (25 mg total) by mouth 3 (three) times daily as needed for dizziness. 30 tablet 0  . Multiple Vitamin (MULTI-VITAMINS) TABS Take 1 tablet by mouth daily.     No current facility-administered medications for this visit.      Objective:  BP 110/70 (BP Location: Left Arm, Patient Position: Sitting, Cuff Size: Normal)   Pulse 67   Temp 97.9 F (36.6 C) (Oral)   Ht 5' 7.25" (1.708 m)   Wt 153 lb 8 oz (69.6 kg)   SpO2 96%   BMI 23.86 kg/m  Gen: NAD, resting comfortably CV: RRR no murmurs rubs or gallops Lungs: CTAB no crackles, wheeze, rhonchi Ext: no edema Skin: warm, dry Neuro: CN II-XII intact, sensation and reflexes normal throughout, 5/5 muscle strength in bilateral upper and lower extremities. Normal finger to nose. Normal rapid alternating movements. No pronator drift. Normal romberg. Normal gait.   Unable to reproduce symptoms with trying to position like he did the other day-or with neck movement today    Assessment and Plan   Dizziness S: on 05/14/2018.   He noted sudden unsteadiness when looking at his hand mirror trying to get a view of his lower back. Thought he had a scratch or rash and when he craned his neck to start looking- denies vertigo but felt like loss of equilibrium/lightheadedness.  Symptoms lasted for several hours. He wondered if perhaps he had not eaten well or was tired at the time of the episode. Started around 5 or 6 PM- tried to lay in bed for an hour or so but that didn't fix the issues. Wife got home 2 hours later- he was laying on the floor and that seemed to hep. Seemed like probably lasted 3-4 hours in total.   Initially went to urgent care but they wouldn't allow him to check in- advised ER visit. Patient was seen in the emergency room on 05/14/2018.  Symptoms significantly improved by the time he got seen in the emergency room.  Denied history of issues with gait.  He had shifted his neck when this occurred.no shortness of breath or neck pain. Had some mild nausea.   Emergency room thought possible dehydration versus BPPV given issues with head positioning.  BMP and CBC were largely reassuring.  No imaging was completed  They did briefly discuss possibility of posterior circulation stroke versus TIA but since symptoms was also quickly-they elected not to proceed with MRI.  He was referred to neurology as well as advised PCP follow-up.  He denies any recurrence of similar. Even went jogging yesterday without any issues. Has had a fatigued feeling in his head slightly more than normal- has mild headache. Does get headaches from time to time with stress or dehydration. He picked up meclizine but has not had to use it.   A/P:  63 year old male with only mild hyperlipidemia reported to the emergency room 2 days ago with dizziness for several hours that has not recurred.  EKG, BMP, CBC were largely reassuring.  He had no chest pain or shortness of breath to suggest CAD.  There was a question of possible TIA- that being said we discussed possibly ordering MRI of the brain-with no recurrence of symptoms since ER visit we ultimately decided not to pursue this.  If had recurrence with neck positioning would consider MRI-though doubt posterior stroke or aneurysm in the  neck. -Discussed possibly canceling neurology visit if no recurrence per patient preference -The unsatisfying thing here is that there is no clear cause.  Doubt BPPV without obvious vertigo and no recurrence with movement that day or later.  He does admit could have had mild dehydration as typically does not drink well during school to avoid having to urinate frequently - His last PSA was low so I doubt metastatic prostate cancer Lab Results  Component Value Date   PSA 0.02 (L) 02/04/2018   PSA 0.04 (L) 08/21/2017   PSA 0.01 (L) 06/08/2017    Future Appointments  Date Time Provider [place]  07/30/2018  2:50 PM Pieter Partridge, DO [facility] None  08/15/2018  8:00 AM Marin Olp, MD [facility]   Visit diagnoses: Dizziness  Hyperlipidemia, unspecified hyperlipidemia type  History of prostate cancer   Time Stamp The duration of face-to-face time during this visit was greater than 25 minutes. Greater than 50% of this time was spent in counseling, explanation of diagnosis, planning of further management, and/or coordination of care including discussing potential causes, discussing potential work-up, reviewing emergency room visit findings, discussing risk of stroke, mass, prostate cancer with mets.     Return precautions advised.  Garret Reddish, MD

## 2018-05-16 NOTE — Patient Instructions (Addendum)
I am glad you have not had recurrence of symptoms  With that being said- we still do not have a clear cause/precipitant for the event that occurred  We considered doing neuroimaging like MRI but ultimately decided only to pursue that for recurrent or other new or worsening symptoms such as headache   I would say if you do not have recurrence of symptoms within the next month that it would be reasonable to cancel neuro visit if you desire. If you still want to sit down with them to get their opinion- that's reasonable as well (but once again I think the main discussion at that point would be MRi or not)

## 2018-06-10 ENCOUNTER — Encounter: Payer: Self-pay | Admitting: Family Medicine

## 2018-06-11 NOTE — Telephone Encounter (Signed)
LVM for the patient to call back and schedule a webex

## 2018-06-11 NOTE — Telephone Encounter (Signed)
Patient states he missed a call from [place] requesting a call back mobile [phone]

## 2018-06-11 NOTE — Telephone Encounter (Signed)
LVM for patient to call back and schedule a [facility]

## 2018-06-12 ENCOUNTER — Ambulatory Visit: Payer: Self-pay

## 2018-06-12 NOTE — Telephone Encounter (Signed)
Patient  States that he returned called.  Wanted to set up a virtual visit.  Attempted to triage Patient then set up appointment.  Patient decided to wait for a return call from office Nurse  Who originally called him.

## 2018-06-14 ENCOUNTER — Ambulatory Visit (INDEPENDENT_AMBULATORY_CARE_PROVIDER_SITE_OTHER): Payer: Commercial Managed Care - PPO | Admitting: Family Medicine

## 2018-06-14 ENCOUNTER — Encounter: Payer: Self-pay | Admitting: Family Medicine

## 2018-06-14 ENCOUNTER — Ambulatory Visit: Payer: Commercial Managed Care - PPO | Admitting: Family Medicine

## 2018-06-14 VITALS — HR 62 | Ht 67.5 in | Wt 151.0 lb

## 2018-06-14 DIAGNOSIS — R42 Dizziness and giddiness: Secondary | ICD-10-CM | POA: Diagnosis not present

## 2018-06-14 DIAGNOSIS — E785 Hyperlipidemia, unspecified: Secondary | ICD-10-CM

## 2018-06-14 NOTE — Patient Instructions (Addendum)
Video visit

## 2018-06-14 NOTE — Progress Notes (Signed)
Phone [phone]   Subjective:  Virtual visit via Video note  Our team/I connected with Joylene Draft on 06/14/18 at  3:40 PM EDT by a video enabled telemedicine application (webex) and verified that I am speaking with the correct person using two identifiers.  Location patient: Home[facility] Location provider: [facility], office Persons participating in the virtual visit:  patient  Our team/I discussed the limitations of evaluation and management by telemedicine and the availability of in person appointments. In light of current covid-19 pandemic, patient also understands that we are trying to protect them by minimizing in office contact if at all possible.  The patient expressed consent for telemedicine visit and agreed to proceed.   ROS- No facial or extremity weakness. No slurred words or trouble swallowing. no blurry vision or double vision. No paresthesias. No confusion or word finding difficulties.    Past Medical History-  Patient Active Problem List   Diagnosis Date Noted  . Hyperlipidemia 04/23/2014    Priority: Medium  . History of prostate cancer 03/02/2016    Priority: Low    Medications- reviewed and updated Current Outpatient Medications  Medication Sig Dispense Refill  . Multiple Vitamin (MULTI-VITAMINS) TABS Take 1 tablet by mouth daily.    . meclizine (ANTIVERT) 25 MG tablet Take 1 tablet (25 mg total) by mouth 3 (three) times daily as needed for dizziness. (Patient not taking: Reported on 06/14/2018) 30 tablet 0   No current facility-administered medications for this visit.      Objective:  Pulse 62   Ht 5' 7.5" (1.715 m)   Wt 151 lb (68.5 kg)   BMI 23.30 kg/m  Gen: NAD, resting comfortably Lungs: nonlabored, normal respiratory rate  Skin: appears dry, no obvious rash Neuro: Normal eye movements, normal facial movements, moves all extremities and does not report weakness.  Able to stand without difficulty.  Was not able to push patient with Romberg maneuver  but he appeared to be well balanced.  No pronator drift.  Normal gait.  Normal finger-to-nose though had to use stationary point in testing.    Assessment and Plan   #Dizziness S:A week ago on Wednesday or thursday he started noting just sitting down or walking around- started to feel like he did back like last month but not nearly as severe as previous with complaint of dizziness/slight imbalance. Symptom Lasted from Thursday through Tuesday of this week. Some lightheadedness/dizziness and light nausea. Slightly uneasy getting up out bed at night-but otherwise no clear orthostatic symptoms.  He only takes a multivitamin as medication  Also he Had mild dull frontal headache. No ringing in the ears or hearing loss. No room spinning/obvious vertigo. Didn't take meclizine as not severe and he thought it would go away.  When he would lay down it would not go away-though it seemed to last time. It was worse with head movement but was different because would not go away with rest. Last visit had occurred with head down and twisted around trying to use a mirror looking at his back.  He states did not have similar triggering symptoms this time.  Due to discussion last time of potential dehydration, he increased hydration but didn't seem to help. He went out for a job in the middle of symptoms and did not worsen or improve symptoms.   patient states he is feeling pretty good today. 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  I think the risk of stroke and TIA is somewhat low-his risk factors include age and mild hyperlipidemia.  We discussed possibly doing an MRI versus having a virtual visit with neurology-he opts to call neurology to move up that appointment if possible for their opinion before considering MRI further.  In regards to differential- some slight worsening with head positioning  so could be BPPV but given ongoing symptoms at rest thought this was less likely.  No recent illness to go along with vestibular neuritis. Meniere's disease- possibility- no tinnitus or hearing loss. No history of migraines- did have some headaches. No stroke/tia personal or family history.  Checks pulse on iphone-had no variation when he had the symptoms making arrhythmia  less likely  In regards to mild hyperlipidemia- I do not feel strongly about starting statin unless we do find evidence of stroke.  We did discuss possibility of carotid artery testing as well as echocardiogram-we will can hold off on those for now unless per suggestion of neurology or concerning findings on possible MRI  Future Appointments  Date Time Provider [place]  07/30/2018  2:50 PM Pieter Partridge, DO [facility] None  08/15/2018  8:00 AM Marin Olp, MD [facility]   Return precautions advised.  Garret Reddish, MD

## 2018-07-25 ENCOUNTER — Encounter: Payer: Self-pay | Admitting: Family Medicine

## 2018-07-25 NOTE — Telephone Encounter (Signed)
Please see message and advise.  Thank you. ° °

## 2018-07-30 ENCOUNTER — Ambulatory Visit: Payer: Commercial Managed Care - PPO | Admitting: Neurology

## 2018-08-01 NOTE — Progress Notes (Signed)
Virtual Visit via Video Note The purpose of this virtual visit is to provide medical care while limiting exposure to the novel coronavirus.    Consent was obtained for video visit:  Yes.   Answered questions that patient had about telehealth interaction:  Yes.   I discussed the limitations, risks, security and privacy concerns of performing an evaluation and management service by telemedicine. I also discussed with the patient that there may be a patient responsible charge related to this service. The patient expressed understanding and agreed to proceed.  Pt location: Home Physician Location: office Name of referring provider:  Deno Etienne, DO I connected with Joylene Draft at patients initiation/request on 08/02/2018 at 12:50 PM EDT by video enabled telemedicine application and verified that I am speaking with the correct person using two identifiers. Pt MRN:  [id number] Pt DOB:  May 25, 1955 Video Participants:  Joylene Draft   History of Present Illness:  Ronnie Hawkins is a 63 year old man who presents for dizziness.  History supplemented by ED and PCP notes.  On 05/14/18, he was twisting back to look at something on his back with a hand mirror when he suddenly developed dizziness, which he described as feeling "swimmy-headed" and objects such as the wall appeared to be undulating.  It was not a spinning sensation or feeling of going to pass out.  He became nauseous.  Dizziness worse upon standing.  It did not improve much when supine.  However, he felt improvement if he was on his knees over the commode.  No associated headache, neck pain, tinnitus, hearing loss, double vision, photophobia, vomiting, or unilateral numbness and weakness.  He presented to the [facility] ED for further evaluation.  Neurologic exam was reportedly normal.  Labs, including CBC and BMP, were unremarkable.  He reported feeling better and deferred MRI of brain.  Entire event lasted about 3-4 hours.    Around 06/05/18, he  had a more mild reoccurrence.  It was not sudden onset but rather gradual.  He is not sure exactly when it started.  The symptoms were quite mild and lasted about 1 and 1/2 days.    Since then, he has not had a recurrence.  He has had some mild dizziness, such as brief dizziness upon standing, but nothing like this.  He denies history of migraines.    Past Medical History: Past Medical History:  Diagnosis Date  . Hyperlipidemia 04/23/2014  . Tonsillar cyst    Eval by Dr. Lucia Gaskins of ENT    Medications: Outpatient Encounter Medications as of 08/02/2018  Medication Sig  . meclizine (ANTIVERT) 25 MG tablet Take 1 tablet (25 mg total) by mouth 3 (three) times daily as needed for dizziness. (Patient not taking: Reported on 06/14/2018)  . Multiple Vitamin (MULTI-VITAMINS) TABS Take 1 tablet by mouth daily.   No facility-administered encounter medications on file as of 08/02/2018.     Allergies: No Known Allergies  Family History: Family History  Problem Relation Age of Onset  . Cancer Mother        breast CA  . Diabetes Mother   . Cancer Father 75       liver- alcohol related  . Heart attack Father 40  . Diabetes Father   . Diabetes Brother     Social History: Social History   Socioeconomic History  . Marital status: Married    Spouse name: Not on file  . Number of children: Not on file  . Years of education: Not  on file  . Highest education level: Not on file  Occupational History  . Not on file  Social Needs  . Financial resource strain: Not on file  . Food insecurity:    Worry: Not on file    Inability: Not on file  . Transportation needs:    Medical: Not on file    Non-medical: Not on file  Tobacco Use  . Smoking status: Never Smoker  . Smokeless tobacco: Never Used  Substance and Sexual Activity  . Alcohol use: No    Alcohol/week: 0.0 standard drinks  . Drug use: No  . Sexual activity: Not on file  Lifestyle  . Physical activity:    Days per week: Not on file     Minutes per session: Not on file  . Stress: Not on file  Relationships  . Social connections:    Talks on phone: Not on file    Gets together: Not on file    Attends religious service: Not on file    Active member of club or organization: Not on file    Attends meetings of clubs or organizations: Not on file    Relationship status: Not on file  . Intimate partner violence:    Fear of current or ex partner: Not on file    Emotionally abused: Not on file    Physically abused: Not on file    Forced sexual activity: Not on file  Other Topics Concern  . Not on file  Social History Narrative   Married. 2 sons (grown). No grandkids yet. No pets.       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 [employer]- [employer].       Hobbies: running and reading    Observations/Objective:   Height 5\' 9"  (1.753 m), weight 150 lb (68 kg). No acute distress.  Alert and oriented.  Speech fluent and not dysarthric.  Language intact.  Face symmetric.  Assessment and Plan:   Episodic dizziness.  He does not describe any lateralizing symptoms to clearly suggest CVA/TIA.  While it is not classic benign paroxysmal positional vertigo, it may still be of inner ear etiology, possibly allergy-induced given the current season.  In any event, he has not had any recurrence for 2 months.  We both decided to continue to monitor.  If he should have a worse recurrence, we may consider MRI of brain.  Follow Up Instructions:    -I discussed the assessment and treatment plan with the patient. The patient was provided an opportunity to ask questions and all were answered. The patient agreed with the plan and demonstrated an understanding of the instructions.   The patient was advised to call back or seek an in-person evaluation if the symptoms worsen or if the condition fails to improve as anticipated.  Dudley Major, DO

## 2018-08-02 ENCOUNTER — Telehealth (INDEPENDENT_AMBULATORY_CARE_PROVIDER_SITE_OTHER): Payer: Commercial Managed Care - PPO | Admitting: Neurology

## 2018-08-02 ENCOUNTER — Encounter: Payer: Self-pay | Admitting: Neurology

## 2018-08-02 ENCOUNTER — Other Ambulatory Visit: Payer: Self-pay

## 2018-08-02 VITALS — Ht 69.0 in | Wt 150.0 lb

## 2018-08-02 DIAGNOSIS — R42 Dizziness and giddiness: Secondary | ICD-10-CM | POA: Diagnosis not present

## 2018-08-11 ENCOUNTER — Encounter: Payer: Self-pay | Admitting: Family Medicine

## 2018-08-12 NOTE — Telephone Encounter (Signed)
See note. Patient also called and requested to have his labs prior to his visit. I advised that we have not been scheduling labs prior to physicals however, at the visit or after they would be done. Patient requests clinical team to ask Dr. Yong Channel on approval for having them done before and ordering labs.

## 2018-08-15 ENCOUNTER — Encounter: Payer: Self-pay | Admitting: Family Medicine

## 2018-08-15 ENCOUNTER — Ambulatory Visit (INDEPENDENT_AMBULATORY_CARE_PROVIDER_SITE_OTHER): Payer: Commercial Managed Care - PPO | Admitting: Family Medicine

## 2018-08-15 ENCOUNTER — Other Ambulatory Visit: Payer: Self-pay

## 2018-08-15 VITALS — BP 116/76 | HR 56 | Temp 97.5°F | Ht 68.0 in | Wt 148.8 lb

## 2018-08-15 DIAGNOSIS — I878 Other specified disorders of veins: Secondary | ICD-10-CM | POA: Diagnosis not present

## 2018-08-15 DIAGNOSIS — Z0000 Encounter for general adult medical examination without abnormal findings: Secondary | ICD-10-CM | POA: Diagnosis not present

## 2018-08-15 DIAGNOSIS — E785 Hyperlipidemia, unspecified: Secondary | ICD-10-CM

## 2018-08-15 DIAGNOSIS — Z8546 Personal history of malignant neoplasm of prostate: Secondary | ICD-10-CM

## 2018-08-15 DIAGNOSIS — Z23 Encounter for immunization: Secondary | ICD-10-CM | POA: Diagnosis not present

## 2018-08-15 DIAGNOSIS — R7989 Other specified abnormal findings of blood chemistry: Secondary | ICD-10-CM

## 2018-08-15 LAB — COMPREHENSIVE METABOLIC PANEL
ALT: 21 U/L (ref 0–53)
AST: 20 U/L (ref 0–37)
Albumin: 4.3 g/dL (ref 3.5–5.2)
Alkaline Phosphatase: 66 U/L (ref 39–117)
BUN: 17 mg/dL (ref 6–23)
CO2: 32 mEq/L (ref 19–32)
Calcium: 9.5 mg/dL (ref 8.4–10.5)
Chloride: 102 mEq/L (ref 96–112)
Creatinine, Ser: 0.91 mg/dL (ref 0.40–1.50)
GFR: 84.49 mL/min (ref >60.00)
Glucose, Bld: 93 mg/dL (ref 70–99)
Potassium: 5 mEq/L (ref 3.5–5.1)
Sodium: 139 mEq/L (ref 135–145)
Total Bilirubin: 0.8 mg/dL (ref 0.2–1.2)
Total Protein: 6.6 g/dL (ref 6.0–8.3)

## 2018-08-15 LAB — CBC
HCT: 46.8 % (ref 39.0–52.0)
Hemoglobin: 16 g/dL (ref 13.0–17.0)
MCHC: 34.1 g/dL (ref 30.0–36.0)
MCV: 97.2 fl (ref 78.0–100.0)
Platelets: 261 10*3/uL (ref 150.0–400.0)
RBC: 4.82 Mil/uL (ref 4.22–5.81)
RDW: 13 % (ref 11.5–15.5)
WBC: 4.3 10*3/uL (ref 4.0–10.5)

## 2018-08-15 LAB — LIPID PANEL
Cholesterol: 195 mg/dL (ref 0–200)
HDL: 48.5 mg/dL (ref >39.00)
LDL Cholesterol: 128 mg/dL — ABNORMAL HIGH (ref 0–99)
NonHDL: 146.46
Total CHOL/HDL Ratio: 4
Triglycerides: 93 mg/dL (ref 0.0–149.0)
VLDL: 18.6 mg/dL (ref 0.0–40.0)

## 2018-08-15 LAB — PSA: PSA: 0.02 ng/mL — ABNORMAL LOW (ref 0.10–4.00)

## 2018-08-15 NOTE — Addendum Note (Signed)
Addended by: Gwenyth Ober R on: 08/15/2018 08:44 AM   Modules accepted: Orders

## 2018-08-15 NOTE — Patient Instructions (Addendum)
Health Maintenance Due  Topic Date Due  . TETANUS/TDAP - today  07/09/2018   Please stop by lab before you go If you do not have mychart- we will call you about results within 5 business days of [place] receiving them.  If you have mychart- we will send your results within 3 business days of [place] receiving them.  If abnormal or we want to clarify a result, we will call or mychart you to make sure you receive the message.  If you have questions or concerns or don't hear within 5-7 days, please send [place] a message or call us.   Pt is fasting

## 2018-08-15 NOTE — Progress Notes (Signed)
Phone: [phone]   Subjective:  Patient presents today for their annual physical. Chief complaint-noted.   See problem oriented charting- ROS- full  review of systems was completed and negative except for: some more noticeable veins behind left knee- some on right  The following were reviewed and entered/updated in epic: Past Medical History:  Diagnosis Date  . Hyperlipidemia 04/23/2014  . Tonsillar cyst    Eval by Dr. Lucia Gaskins of ENT   Patient Active Problem List   Diagnosis Date Noted  . Hyperlipidemia 04/23/2014    Priority: Medium  . History of prostate cancer 03/02/2016    Priority: Low   Past Surgical History:  Procedure Laterality Date  . EYE SURGERY     laser surgery for retina  . VASECTOMY      Family History  Problem Relation Age of Onset  . Cancer Mother        breast CA  . Diabetes Mother   . Cancer Father 62       liver- alcohol related  . Heart attack Father 34  . Diabetes Father   . Diabetes Brother     Medications- reviewed and updated Current Outpatient Medications  Medication Sig Dispense Refill  . Multiple Vitamin (MULTI-VITAMINS) TABS Take 1 tablet by mouth daily.    . meclizine (ANTIVERT) 25 MG tablet Take 1 tablet (25 mg total) by mouth 3 (three) times daily as needed for dizziness. (Patient not taking: Reported on 08/15/2018) 30 tablet 0   No current facility-administered medications for this visit.     Allergies-reviewed and updated No Known Allergies  Social History   Social History Narrative   Married. 2 sons (grown). No grandkids yet. No pets.       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 [place] [employer]- [employer].       Hobbies: running and reading       Patient is right-handed. He lives in a 2 level home with his wife.   Objective  Objective:  BP 116/76   Pulse (!) 56   Temp (!) 97.5 F (36.4 C) (Oral)   Ht 5\' 8"  (1.727 m)   Wt 148 lb 12.8 oz (67.5 kg)   SpO2 97%   BMI  22.62 kg/m  Gen: NAD, resting comfortably HEENT: Mucous membranes are moist. Oropharynx normal Neck: no thyromegaly or cervical lymphadenopathy CV: RRR no murmurs rubs or gallops Lungs: CTAB no crackles, wheeze, rhonchi Abdomen: soft/nontender/nondistended/normal bowel sounds. No rebound or guarding.  Ext: no edema Skin: warm, dry Neuro: grossly normal, moves all extremities, PERRLA    Assessment and Plan  63 y.o. male presenting for annual physical.  Health Maintenance counseling: 1. Anticipatory guidance: Patient counseled regarding regular dental exams -q6 months, eye exams -yearly or every 2,  avoiding smoking and second hand smoke , limiting alcohol to 2 beverages per day - well under that.   2. Risk factor reduction:  Advised patient of need for regular exercise and diet rich and fruits and vegetables to reduce risk of heart attack and stroke. Exercise- has picked this up about an hour a day. Diet-reasonably healthy (wife thinks he could pick up his greens).  Wt Readings from Last 3 Encounters:  08/15/18 148 lb 12.8 oz (67.5 kg)  08/02/18 150 lb (68 kg)  06/14/18 151 lb (68.5 kg)  3. Immunizations/screenings/ancillary studies- Tdap today. Discussed shingrix but will delay due to potential fever SE and being in middle of covid 19 pandemic  Immunization History  Administered Date(s) Administered  . Influenza,inj,Quad PF,6+ Mos 12/10/2014  . Influenza-Unspecified 12/11/2012, 12/29/2013, 12/13/2015, 12/28/2016, 12/17/2017  . PPD Test 05/06/2014  . Td 07/08/2008  . Typhoid Inactivated 08/22/2016  4. Prostate cancer follow up-  history of prostate cancer, we will update psa with labs. Robotic prostatectomy in [place] 10/22/15.  Lab Results  Component Value Date   PSA 0.02 (L) 02/04/2018   PSA 0.04 (L) 08/21/2017   PSA 0.01 (L) 06/08/2017   5. Colon cancer screening -  10/02/17 with 10 year repeat planned 6. Skin cancer screening-referred to derm last year. dermatology specialists.  advised regular sunscreen use. Denies worrisome, changing, or new skin lesions.  7. Never smoker  Status of chronic or acute concerns  #Had dizziness episode several months ago- we thought low risk stroke/TIA overall. Referred to neurology and had virtual visit. Some head positioning related to it- BPPV thought possibility. Saw neurology and thought to still likely be inner ear related- possibly allergy related but perhaps not BPPV. Had not had recurrence in 2 months so they opted to monitor and hold off on MRI  #Hyperlipidemia- mild, will update lipids today and calculate 10 year ascvd risk  # veins just behind knees seems to be more on left are more visible. Old injury on left ankle but not swelling. No calf pain.  - overall low risk for DVT but to be on the safe side we opted to get a d dimer today  1 year physical Lab/Order associations: fasting Preventative health care - Plan: CBC, Comprehensive metabolic panel, Lipid panel, PSA  Hyperlipidemia, unspecified hyperlipidemia type - Plan: CBC, Comprehensive metabolic panel, Lipid panel  History of prostate cancer - Plan: PSA  Prominent vein - Plan: D-Dimer, Quantitative  Return precautions advised.  Garret Reddish, MD

## 2018-08-16 ENCOUNTER — Telehealth: Payer: Self-pay

## 2018-08-16 ENCOUNTER — Ambulatory Visit (HOSPITAL_COMMUNITY)
Admission: RE | Admit: 2018-08-16 | Discharge: 2018-08-16 | Disposition: A | Discharge status: Home / Self Care | Payer: Commercial Managed Care - PPO | Source: Ambulatory Visit | Attending: Family Medicine | Admitting: Family Medicine

## 2018-08-16 ENCOUNTER — Encounter: Payer: Self-pay | Admitting: Family Medicine

## 2018-08-16 ENCOUNTER — Telehealth: Payer: Self-pay | Admitting: Family Medicine

## 2018-08-16 DIAGNOSIS — R7989 Other specified abnormal findings of blood chemistry: Secondary | ICD-10-CM | POA: Diagnosis present

## 2018-08-16 DIAGNOSIS — I878 Other specified disorders of veins: Secondary | ICD-10-CM

## 2018-08-16 DIAGNOSIS — E785 Hyperlipidemia, unspecified: Secondary | ICD-10-CM

## 2018-08-16 LAB — D-DIMER, QUANTITATIVE: D-Dimer, Quant: 0.5 mcg/mL FEU — ABNORMAL HIGH (ref <0.50)

## 2018-08-16 MED ORDER — ATORVASTATIN CALCIUM 20 MG PO TABS: 20.0000 mg | ORAL_TABLET | ORAL | 3 refills | Status: DC

## 2018-08-16 NOTE — Telephone Encounter (Signed)
Copied from [place] [id number]. Topic: Appointment Scheduling - Scheduling Inquiry for Clinic >> Aug 16, 2018  8:18 AM Rayann Heman wrote: Reason for CRM: pt called and read lab result notes on mychart and would like to schedule a DVT. Please advise

## 2018-08-16 NOTE — Assessment & Plan Note (Signed)
We reviewed statin side effects together- he is willing to trial low dose statin- atorvastatin 20mg  weekly

## 2018-08-16 NOTE — Telephone Encounter (Signed)
See note

## 2018-08-16 NOTE — Addendum Note (Signed)
Addended by: Marin Olp on: 08/16/2018 07:53 AM   Modules accepted: Orders

## 2018-08-16 NOTE — Telephone Encounter (Signed)
See HLD notes

## 2018-08-16 NOTE — Telephone Encounter (Signed)
Pt has been scheduled for his DVT scan and is getting it today.

## 2018-08-16 NOTE — Telephone Encounter (Signed)
Megan called and pt was neg for DVT

## 2018-08-28 ENCOUNTER — Encounter: Payer: Commercial Managed Care - PPO | Admitting: Family Medicine

## 2019-02-05 ENCOUNTER — Encounter: Payer: Self-pay | Admitting: Family Medicine

## 2019-02-07 ENCOUNTER — Encounter: Payer: Self-pay | Admitting: Family Medicine

## 2019-02-10 ENCOUNTER — Other Ambulatory Visit: Payer: Self-pay

## 2019-02-10 DIAGNOSIS — Z87898 Personal history of other specified conditions: Secondary | ICD-10-CM

## 2019-02-10 DIAGNOSIS — E785 Hyperlipidemia, unspecified: Secondary | ICD-10-CM

## 2019-02-11 ENCOUNTER — Encounter: Payer: Self-pay | Admitting: Family Medicine

## 2019-02-11 ENCOUNTER — Ambulatory Visit (INDEPENDENT_AMBULATORY_CARE_PROVIDER_SITE_OTHER): Payer: Commercial Managed Care - PPO

## 2019-02-11 ENCOUNTER — Other Ambulatory Visit: Payer: Self-pay

## 2019-02-11 ENCOUNTER — Ambulatory Visit (INDEPENDENT_AMBULATORY_CARE_PROVIDER_SITE_OTHER): Payer: Commercial Managed Care - PPO | Admitting: Family Medicine

## 2019-02-11 ENCOUNTER — Ambulatory Visit: Payer: Self-pay

## 2019-02-11 VITALS — BP 112/68 | HR 72 | Ht 68.0 in | Wt 150.0 lb

## 2019-02-11 DIAGNOSIS — M25572 Pain in left ankle and joints of left foot: Secondary | ICD-10-CM

## 2019-02-11 DIAGNOSIS — E785 Hyperlipidemia, unspecified: Secondary | ICD-10-CM | POA: Diagnosis not present

## 2019-02-11 DIAGNOSIS — Z87898 Personal history of other specified conditions: Secondary | ICD-10-CM

## 2019-02-11 NOTE — Progress Notes (Signed)
Subjective:    CC: L ankle pain  I, Molly Weber, LAT, ATC, am serving as scribe for Dr. [name].  HPI: Pt is a 63 y/o male presenting w/ c/o L ankle pain x 6 years since his first ankle sprain.  Pt is an avid runner and experiences intermittent ankle pain.  Pt notes that his L ankle pain worsened over the last month for no specific reason and this pain forced him to stop running.  He states that his pain location has shifted from the anterior L ankle to the L Achille's.  Pt currently rates his pain as a 5/10 at it's worst and describes his pain as a burning/tearing pain.  Pt denies any radiating pain and denies any numbness/tingling.  He notes there is some swelling associated with the ankle soreness at times.  His symptoms are worsened w/ running and has stopped running.  He has not tried any treatments.  Past medical history, Surgical history, Family history not pertinant except as noted below, Social history, Allergies, and medications have been entered into the medical record, reviewed, and no changes needed.   Review of Systems: No headache, visual changes, nausea, vomiting, diarrhea, constipation, dizziness, abdominal pain, skin rash, fevers, chills, night sweats, weight loss, swollen lymph nodes, body aches, joint swelling, muscle aches, chest pain, shortness of breath, mood changes, visual or auditory hallucinations.   Objective:    Vitals:   02/11/19 1520  BP: 112/68  Pulse: 72  SpO2: 97%   General: Well Developed, well nourished, and in no acute distress.  Neuro/Psych: Alert and oriented x3, extra-ocular muscles intact, able to move all 4 extremities, sensation grossly intact. Skin: Warm and dry, no rashes noted.  Respiratory: Not using accessory muscles, speaking in full sentences, trachea midline.  Cardiovascular: Pulses palpable, no extremity edema. Abdomen: Does not appear distended. MSK: Left ankle normal-appearing no effusion. Normal motion.   Nontender.  Stable ligamentous exam. Intact strength. Pulses cap refill and sensation are intact distally.  Lab and Radiology Results X-ray images left ankle obtained today personally independently reviewed. No acute fractures visible.  Mild degenerative changes.  Decreased bone density present at talus.  No lytic lesions. Await formal radiology review.  Limited musculoskeletal ultrasound left ankle. Intact normal-appearing Achilles tendon.  Small retrocalcaneal bursa enlargement present. Normal-appearing medial ankle structures posterior tibialis tendon flexor digitorum neurovascular structures. Normal lateral ankle structures including peroneal tendons Anterior ankle with irregularity at the anterior lateral corner of the talus.  No significant effusion. Impression: Mild talus DJD, retrocalcaneal bursitis present.  Impression and Recommendations:    Assessment and Plan: 63 y.o. male with left ankle pain following ankle sprain.  Patient does have some irregularity and degenerative changes present at ankle joint.  The talus has decreased bone mineral density per my read however formal radiology review is still pending.  Plan to proceed with diclofenac gel home exercise program and compressive ankle sleeve.  Recheck in 1 month.  May consider advanced imaging.  PDMP not reviewed this encounter. Orders Placed This Encounter  Procedures  . No Chg  [place] Lower Lt    Order Specific Question:   Reason for Exam (SYMPTOM  OR DIAGNOSIS REQUIRED)    Answer:   left ankle pain    Order Specific Question:   Preferred imaging location?    Answer:   [place]  . DG Ankle Complete Left    Standing Status:   Future    Number of Occurrences:  1    Standing Expiration Date:   04/13/2020    Order Specific Question:   Reason for Exam (SYMPTOM  OR DIAGNOSIS REQUIRED)    Answer:   eval ankle pain left chronic xyears worse 6 months    Order Specific Question:   Preferred imaging location?    Answer:    [facility]    Order Specific Question:   Radiology Contrast Protocol - do NOT remove file path    Answer:   \\[email or web address]   No orders of the defined types were placed in this encounter.   Discussed warning signs or symptoms. Please see discharge instructions. Patient expresses understanding.   The above documentation has been reviewed and is accurate and complete [name]

## 2019-02-11 NOTE — Patient Instructions (Addendum)
I recommend the body helix compression full ankle sleeve.  Use over the counter voltaren gel up to 4x daily.  I will get the radiology report to you. In my opinion the Talus ankle bone looks a little funny.  We may be getting a CT or MRI to further evaluate it.     Please perform the exercise program that we have prepared for you and gone over in detail on a daily basis.  In addition to the handout you were provided you can access your program through: [email or web address]   Your unique program code is: [place] to recheck in about 1 month.   We're moving!  Dr. Clovis Riley new office will be located at [address] on the 1st floor.  This location is across the street from the [employer] and in the same complex as the [facility] and [employer]  Our new office phone number will be [phone].  We anticipate beginning to see patients at the [facility] office in early December 2020.

## 2019-02-12 ENCOUNTER — Telehealth: Payer: Self-pay | Admitting: Family Medicine

## 2019-02-12 ENCOUNTER — Encounter: Payer: Self-pay | Admitting: Family Medicine

## 2019-02-12 DIAGNOSIS — M889 Osteitis deformans of unspecified bone: Secondary | ICD-10-CM

## 2019-02-12 DIAGNOSIS — Z87898 Personal history of other specified conditions: Secondary | ICD-10-CM

## 2019-02-12 DIAGNOSIS — Z8546 Personal history of malignant neoplasm of prostate: Secondary | ICD-10-CM

## 2019-02-12 DIAGNOSIS — M7752 Other enthesopathy of left foot: Secondary | ICD-10-CM

## 2019-02-12 LAB — LIPID PANEL
Cholesterol: 178 mg/dL (ref 0–200)
HDL: 47.5 mg/dL (ref >39.00)
LDL Cholesterol: 94 mg/dL (ref 0–99)
NonHDL: 130.69
Total CHOL/HDL Ratio: 4
Triglycerides: 183 mg/dL — ABNORMAL HIGH (ref 0.0–149.0)
VLDL: 36.6 mg/dL (ref 0.0–40.0)

## 2019-02-12 LAB — PSA: PSA: 0.01 ng/mL — ABNORMAL LOW (ref 0.10–4.00)

## 2019-02-12 NOTE — Telephone Encounter (Signed)
Called and LM for pt to call office and schedule lab appt at his earliest convenience.

## 2019-02-12 NOTE — Telephone Encounter (Signed)
MRI ordered to further characterize ankle pain and further evaluate for Paget's disease or possible radiographically occult fracture and as well to characterize retrocalcaneal bursitis or other tendinopathy.

## 2019-02-12 NOTE — Progress Notes (Signed)
Radiology read of ankle also shows abnormality at the talus ankle bone like we talked about.  Their concern for Paget's disease which is a change in how the bone it is metabolized.  Plan to get MRI for further evaluation.  You should hear about scheduling soon.  Let me know if you do not hear anything. We will also get some labs to further evaluate this. Please schedule lab appointment here at [facility]

## 2019-02-12 NOTE — Telephone Encounter (Signed)
Labs ordered to further characterize possible Paget's disease.  Patient should schedule lab appointment in near future.

## 2019-02-13 NOTE — Telephone Encounter (Signed)
Called and spoke to pt.  He is scheduled for a lab appt this upcoming Monday, Dec. 7th at 3:45 pm.

## 2019-02-17 ENCOUNTER — Other Ambulatory Visit: Payer: Self-pay | Admitting: Family Medicine

## 2019-02-17 ENCOUNTER — Other Ambulatory Visit: Payer: Self-pay

## 2019-02-17 ENCOUNTER — Other Ambulatory Visit (INDEPENDENT_AMBULATORY_CARE_PROVIDER_SITE_OTHER): Payer: Commercial Managed Care - PPO

## 2019-02-17 DIAGNOSIS — M889 Osteitis deformans of unspecified bone: Secondary | ICD-10-CM

## 2019-02-17 DIAGNOSIS — Z87898 Personal history of other specified conditions: Secondary | ICD-10-CM

## 2019-02-17 DIAGNOSIS — Z8546 Personal history of malignant neoplasm of prostate: Secondary | ICD-10-CM | POA: Diagnosis not present

## 2019-02-18 LAB — COMPREHENSIVE METABOLIC PANEL
ALT: 28 U/L (ref 0–53)
AST: 25 U/L (ref 0–37)
Albumin: 4.3 g/dL (ref 3.5–5.2)
Alkaline Phosphatase: 63 U/L (ref 39–117)
BUN: 15 mg/dL (ref 6–23)
CO2: 31 mEq/L (ref 19–32)
Calcium: 9.5 mg/dL (ref 8.4–10.5)
Chloride: 102 mEq/L (ref 96–112)
Creatinine, Ser: 0.95 mg/dL (ref 0.40–1.50)
GFR: 80.27 mL/min (ref >60.00)
Glucose, Bld: 88 mg/dL (ref 70–99)
Potassium: 4.1 mEq/L (ref 3.5–5.1)
Sodium: 141 mEq/L (ref 135–145)
Total Bilirubin: 0.7 mg/dL (ref 0.2–1.2)
Total Protein: 6.5 g/dL (ref 6.0–8.3)

## 2019-02-18 LAB — CBC WITH DIFFERENTIAL/PLATELET
Basophils Absolute: 0 10*3/uL (ref 0.0–0.1)
Basophils Relative: 0.3 % (ref 0.0–3.0)
Eosinophils Absolute: 0.2 10*3/uL (ref 0.0–0.7)
Eosinophils Relative: 2.9 % (ref 0.0–5.0)
HCT: 45.8 % (ref 39.0–52.0)
Hemoglobin: 15.6 g/dL (ref 13.0–17.0)
Lymphocytes Relative: 29.6 % (ref 12.0–46.0)
Lymphs Abs: 2 10*3/uL (ref 0.7–4.0)
MCHC: 34.1 g/dL (ref 30.0–36.0)
MCV: 97.2 fl (ref 78.0–100.0)
Monocytes Absolute: 0.7 10*3/uL (ref 0.1–1.0)
Monocytes Relative: 10.1 % (ref 3.0–12.0)
Neutro Abs: 3.8 10*3/uL (ref 1.4–7.7)
Neutrophils Relative %: 57.1 % (ref 43.0–77.0)
Platelets: 257 10*3/uL (ref 150.0–400.0)
RBC: 4.71 Mil/uL (ref 4.22–5.81)
RDW: 13 % (ref 11.5–15.5)
WBC: 6.6 10*3/uL (ref 4.0–10.5)

## 2019-02-18 LAB — VITAMIN D 25 HYDROXY (VIT D DEFICIENCY, FRACTURES): VITD: 50.77 ng/mL (ref 30.00–100.00)

## 2019-02-18 NOTE — Progress Notes (Signed)
Labs all look pretty normal. This is good news.

## 2019-02-25 ENCOUNTER — Ambulatory Visit (INDEPENDENT_AMBULATORY_CARE_PROVIDER_SITE_OTHER): Payer: Commercial Managed Care - PPO

## 2019-02-25 ENCOUNTER — Other Ambulatory Visit: Payer: Self-pay

## 2019-02-25 DIAGNOSIS — M7752 Other enthesopathy of left foot: Secondary | ICD-10-CM | POA: Diagnosis not present

## 2019-02-25 DIAGNOSIS — M889 Osteitis deformans of unspecified bone: Secondary | ICD-10-CM

## 2019-02-26 ENCOUNTER — Ambulatory Visit (INDEPENDENT_AMBULATORY_CARE_PROVIDER_SITE_OTHER): Payer: Commercial Managed Care - PPO | Admitting: Family Medicine

## 2019-02-26 ENCOUNTER — Encounter: Payer: Self-pay | Admitting: Family Medicine

## 2019-02-26 ENCOUNTER — Other Ambulatory Visit: Payer: Self-pay

## 2019-02-26 VITALS — BP 100/68 | HR 76 | Ht 68.0 in | Wt 150.2 lb

## 2019-02-26 DIAGNOSIS — M889 Osteitis deformans of unspecified bone: Secondary | ICD-10-CM | POA: Diagnosis not present

## 2019-02-26 HISTORY — DX: Osteitis deformans of unspecified bone: M88.9

## 2019-02-26 MED ORDER — ZOLEDRONIC ACID 5 MG/100ML IV SOLN: 5.0000 mg | Freq: Once | INTRAVENOUS | 0 refills | Status: AC

## 2019-02-26 NOTE — Progress Notes (Signed)
I, Ronnie Hawkins, LAT, ATC, am serving as scribe for Dr. [name].  Ronnie Hawkins is a 63 y.o. male who presents to [place] today for f/u of his L ankle pain and review of his L ankle MRI.  Pt was last seen by Dr. [name] on 02/11/19 for L ankle pain that flared up during November 2020.  Pt had a L ankle XR on 02/11/19 and a L ankle MRI was subsequently ordered that the pt had on 02/25/19.  Since his last visit, pt reports that his L ankle and Achille's are feeling better.  He has resumed running and is doing a run-walk combo as suggested.  He is running for half a mile and walking for 1/4 mile for 3 miles.  He rates his pain as a 2/10 currently.  He has been doing his HEP 5-6 days/week.     ROS:  As above  Exam:  BP 100/68 (BP Location: Left Arm, Patient Position: Sitting, Cuff Size: Normal)   Pulse 76   Ht 5\' 8"  (1.727 m)   Wt 150 lb 3.2 oz (68.1 kg)   SpO2 96%   BMI 22.84 kg/m  Wt Readings from Last 5 Encounters:  02/26/19 150 lb 3.2 oz (68.1 kg)  02/11/19 150 lb (68 kg)  08/15/18 148 lb 12.8 oz (67.5 kg)  08/02/18 150 lb (68 kg)  06/14/18 151 lb (68.5 kg)   General: Well Developed, well nourished, and in no acute distress.  Neuro/Psych: Alert and oriented x3, extra-ocular muscles intact, able to move all 4 extremities, sensation grossly intact. Skin: Warm and dry, no rashes noted.  Respiratory: Not using accessory muscles, speaking in full sentences, trachea midline.  Cardiovascular: Pulses palpable, no extremity edema. Abdomen: Does not appear distended. MSK: Left ankle normal-appearing normal motion.    Lab and Radiology Results No results found for this or any previous visit (from the past 72 hour(s)). MR ANKLE LEFT WO CONTRAST  Result Date: 02/25/2019 CLINICAL DATA:  Chronic left ankle pain EXAM: MRI OF THE LEFT ANKLE WITHOUT CONTRAST TECHNIQUE: Multiplanar, multisequence MR imaging of the ankle was performed. No intravenous contrast was  administered. COMPARISON:  Radiograph February 11, 2019 FINDINGS: TENDONS Peroneal: Peroneal longus tendon intact. Peroneal brevis intact. There is a small amount of fluid seen surrounding the peroneal tendons, however they are intact. Posteromedial: Posterior tibial tendon intact. Flexor hallucis longus tendon intact. Flexor digitorum longus tendon intact. There is a small amount of fluid seen surrounding the posterior tibialis, flexor digitorum, flexor hallucis longus tendons. Anterior: Tibialis anterior tendon intact. Extensor hallucis longus tendon intact Extensor digitorum longus tendon intact. Achilles:  Intact. Plantar Fascia: Intact. LIGAMENTS Lateral: There is fragmented ossicle seen adjacent to the anterior talofibular ligament which appears to be intact. There is heterogeneous signal seen within the posterior talofibular ligament, however it is intact. Calcaneofibular ligament intact. Anterior and posterior tibiofibular ligaments intact. Medial: Deltoid ligament intact. Spring ligament intact. CARTILAGE Ankle Joint: No joint effusion. There is diffuse chondral thinning seen at the tibiotalar joint. Subtalar Joints/Sinus Tarsi: There is edema seen within the sinus tarsi. No subtalar joint effusion. Normal sinus tarsi. Bones: There is coarsened trabecular markings with increased T2 signal seen throughout the talus. No areas of cortical destruction or avascular necrosis are noted. The remainder of the osseous structures appear to be intact. Soft Tissue: Retrocalcaneal fat pad edema is noted. IMPRESSION: 1. Findings consistent with Paget's disease of the talus. No osseous fracture or avascular necrosis. 2. 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 3. Mild tenosynovitis involving the posterior compartment and peroneal tendons. 4. Mild tibiotalar joint osteoarthritis. Electronically Signed   By: Prudencio Pair M.D.   On: 02/25/2019 10:19     I, [name], personally (independently) visualized and performed the interpretation of the images attached in this note.  Recent Results (from the past 2160 hour(s))  Lipid Panel     Status: Abnormal   Collection Time: 02/11/19  4:30 PM  Result Value Ref Range   Cholesterol 178 0 - 200 mg/dL    Comment: ATP III Classification       Desirable:  < 200 mg/dL               Borderline High:  200 - 239 mg/dL          High:  > = 240 mg/dL   Triglycerides 183.0 (H) 0.0 - 149.0 mg/dL    Comment: Normal:  <150 mg/dLBorderline High:  150 - 199 mg/dL   HDL 47.50 >39.00 mg/dL   VLDL 36.6 0.0 - 40.0 mg/dL   LDL Cholesterol 94 0 - 99 mg/dL   Total CHOL/HDL Ratio 4     Comment:                Men          Women1/2 Average Risk     3.4          3.3Average Risk          5.0          4.42X Average Risk          9.6          7.13X Average Risk          15.0          11.0                       NonHDL 130.69     Comment: NOTE:  Non-HDL goal should be 30 mg/dL higher than patient's LDL goal (i.e. LDL goal of < 70 mg/dL, would have non-HDL goal of < 100 mg/dL)  PSA     Status: Abnormal   Collection Time: 02/11/19  4:30 PM  Result Value Ref Range   PSA 0.01 (L) 0.10 - 4.00 ng/mL    Comment: Test performed using Access Hybritech PSA Assay, a parmagnetic partical, chemiluminecent immunoassay.  CMP     Status: None   Collection Time: 02/17/19  3:56 PM  Result Value Ref Range   Sodium 141 135 - 145 mEq/L   Potassium 4.1 3.5 - 5.1 mEq/L   Chloride 102 96 - 112 mEq/L   CO2 31 19 - 32 mEq/L   Glucose, Bld 88 70 - 99 mg/dL   BUN 15 6 - 23 mg/dL   Creatinine, Ser 0.95 0.40 - 1.50 mg/dL   Total Bilirubin 0.7 0.2 - 1.2 mg/dL   Alkaline Phosphatase 63 39 - 117 U/L   AST 25 0 - 37 U/L   ALT 28 0 - 53 U/L   Total Protein 6.5 6.0 - 8.3 g/dL   Albumin 4.3 3.5 - 5.2 g/dL   GFR 80.27 >60.00 mL/min   Calcium 9.5 8.4 - 10.5 mg/dL  CBC with Differential/Platelets     Status: None   Collection Time: 02/17/19  3:56  PM  Result Value Ref Range   WBC 6.6 4.0 - 10.5 K/uL   RBC 4.71 4.22 -  5.81 Mil/uL   Hemoglobin 15.6 13.0 - 17.0 g/dL   HCT 45.8 39.0 - 52.0 %   MCV 97.2 78.0 - 100.0 fl   MCHC 34.1 30.0 - 36.0 g/dL   RDW 13.0 11.5 - 15.5 %   Platelets 257.0 150.0 - 400.0 K/uL   Neutrophils Relative % 57.1 43.0 - 77.0 %   Lymphocytes Relative 29.6 12.0 - 46.0 %   Monocytes Relative 10.1 3.0 - 12.0 %   Eosinophils Relative 2.9 0.0 - 5.0 %   Basophils Relative 0.3 0.0 - 3.0 %   Neutro Abs 3.8 1.4 - 7.7 K/uL   Lymphs Abs 2.0 0.7 - 4.0 K/uL   Monocytes Absolute 0.7 0.1 - 1.0 K/uL   Eosinophils Absolute 0.2 0.0 - 0.7 K/uL   Basophils Absolute 0.0 0.0 - 0.1 K/uL  VITAMIN D 25 Hydroxy (Vit-D Deficiency, Fractures)     Status: None   Collection Time: 02/17/19  3:56 PM  Result Value Ref Range   VITD 50.77 30.00 - 100.00 ng/mL      Assessment and Plan: 63 y.o. male with left ankle pain.  Primary source of pain is Paget's disease.  Patient additionally does have some perineal tenosynovitis as well as degeneration of his ATFL at lateral ankle.  However his pain is both medial and lateral which would indicate Paget's disease his primary cause.  Although his alkaline phosphatase is not elevated this is the most likely explanation of his pain.  After discussion with patient plan to proceed with zoledronic acid infusion.  Work on prior authorization of this and will send to [employer] for scheduling.  Patient expresses understanding and agreement.  Will keep me updated with his progress.  Anticipate return to running protocol about 1 month after infusion.  [facility]  Phone [phone] Fax [phone]  PDMP not reviewed this encounter. No orders of the defined types were placed in this encounter.  Meds ordered this encounter  Medications  . zoledronic acid (RECLAST) 5 MG/100ML SOLN injection    Sig: Inject 100 mLs (5 mg total) into the vein once for 1 dose. q yearly.     Dispense:  100 mL    Refill:  0    Will be given at [facility] stay via order form.    Historical information moved to improve visibility of documentation.  Past Medical History:  Diagnosis Date  . Hyperlipidemia 04/23/2014  . Paget disease of bone 02/26/2019  . Tonsillar cyst    Eval by Dr. Lucia Gaskins of ENT   Past Surgical History:  Procedure Laterality Date  . EYE SURGERY     laser surgery for retina  . VASECTOMY     Social History   Tobacco Use  . Smoking status: Never Smoker  . Smokeless tobacco: Never Used  Substance Use Topics  . Alcohol use: No    Alcohol/week: 0.0 standard drinks   family history includes Cancer in his mother; Cancer (age of onset: 7) in his father; Diabetes in his brother, father, and mother; Heart attack (age of onset: 47) in his father.  Medications: Current Outpatient Medications  Medication Sig Dispense Refill  . atorvastatin (LIPITOR) 20 MG tablet Take 1 tablet (20 mg total) by mouth once a week. 13 tablet 3  . Multiple Vitamin (MULTI-VITAMINS) TABS Take 1 tablet by mouth daily.    . meclizine (ANTIVERT) 25 MG tablet Take 1 tablet (25 mg total) by mouth 3 (three) times daily as needed for  dizziness. (Patient not taking: Reported on 02/11/2019) 30 tablet 0  . zoledronic acid (RECLAST) 5 MG/100ML SOLN injection Inject 100 mLs (5 mg total) into the vein once for 1 dose. q yearly. 100 mL 0   No current facility-administered medications for this visit.   No Known Allergies    Discussed warning signs or symptoms. Please see discharge instructions. Patient expresses understanding.  The above documentation has been reviewed and is accurate and complete [name]

## 2019-02-26 NOTE — Progress Notes (Signed)
MRI ankle confirms Paget's disease of the talus ankle bone.  Fortunately no fracture or avascular necrosis present.   Additionally evidence of previous ankle sprains are present.Additionally some tendinitis and mild arthritis present as well.Treatment of Paget's disease can include multiple different options.  Recommend that you have a follow-up visit with me to discuss in detail what your treatment options are and what next steps will be.

## 2019-02-26 NOTE — Patient Instructions (Signed)
Thank you for coming in today.  You should hear soon about infusion.  Plan to treat this with Zoledronic acid infusion.  This typically works quite well.   Keep me updated.

## 2019-03-05 NOTE — Telephone Encounter (Signed)
Pt called to f/u on his reclast?

## 2019-03-12 ENCOUNTER — Ambulatory Visit: Payer: Commercial Managed Care - PPO | Admitting: Family Medicine

## 2019-04-25 ENCOUNTER — Encounter: Payer: Self-pay | Admitting: Family Medicine

## 2019-04-28 ENCOUNTER — Encounter: Payer: Self-pay | Admitting: Family Medicine

## 2019-06-20 ENCOUNTER — Other Ambulatory Visit: Payer: Self-pay

## 2019-06-20 ENCOUNTER — Other Ambulatory Visit: Payer: Self-pay | Admitting: Family Medicine

## 2019-06-20 ENCOUNTER — Encounter: Payer: Self-pay | Admitting: Family Medicine

## 2019-06-20 ENCOUNTER — Ambulatory Visit (INDEPENDENT_AMBULATORY_CARE_PROVIDER_SITE_OTHER): Payer: Commercial Managed Care - PPO | Admitting: Family Medicine

## 2019-06-20 VITALS — BP 110/76 | HR 60 | Temp 98.0°F | Ht 68.0 in | Wt 148.0 lb

## 2019-06-20 DIAGNOSIS — Z8546 Personal history of malignant neoplasm of prostate: Secondary | ICD-10-CM

## 2019-06-20 DIAGNOSIS — Z0000 Encounter for general adult medical examination without abnormal findings: Secondary | ICD-10-CM | POA: Diagnosis not present

## 2019-06-20 DIAGNOSIS — E785 Hyperlipidemia, unspecified: Secondary | ICD-10-CM | POA: Diagnosis not present

## 2019-06-20 DIAGNOSIS — M889 Osteitis deformans of unspecified bone: Secondary | ICD-10-CM | POA: Diagnosis not present

## 2019-06-20 MED ORDER — TYPHOID VACCINE PO CPDR: 1.0000 | DELAYED_RELEASE_CAPSULE | ORAL | 0 refills | Status: DC

## 2019-06-20 MED ORDER — ATORVASTATIN CALCIUM 40 MG PO TABS: 40.0000 mg | ORAL_TABLET | Freq: Every day | ORAL | 1 refills | Status: DC

## 2019-06-20 NOTE — Progress Notes (Signed)
Phone: [phone]   Subjective:  Patient presents today for their annual physical. Chief complaint-noted.   See problem oriented charting- ROS- full  review of systems was completed and negative  No reported chest pain, shortness of breath. Does have some left heel pain from pagets  The following were reviewed and entered/updated in epic: Past Medical History:  Diagnosis Date  . Hyperlipidemia 04/23/2014  . Paget disease of bone 02/26/2019  . Tonsillar cyst    Eval by Dr. Lucia Gaskins of ENT   Patient Active Problem List   Diagnosis Date Noted  . Hyperlipidemia 04/23/2014    Priority: Medium  . History of prostate cancer 03/02/2016    Priority: Low  . Paget disease of bone 02/26/2019   Past Surgical History:  Procedure Laterality Date  . EYE SURGERY     laser surgery for retina  . VASECTOMY      Family History  Problem Relation Age of Onset  . Cancer Mother        breast CA  . Diabetes Mother   . Cancer Father 14       liver- alcohol related  . Heart attack Father 72  . Diabetes Father   . Diabetes Brother     Medications- reviewed and updated Current Outpatient Medications  Medication Sig Dispense Refill  . atorvastatin (LIPITOR) 40 MG tablet Take 1 tablet (40 mg total) by mouth daily. 30 tablet 1  . Multiple Vitamin (MULTI-VITAMINS) TABS Take 1 tablet by mouth daily.    . typhoid (VIVOTIF) DR capsule Take 1 capsule by mouth every other day. 4 capsule 0   No current facility-administered medications for this visit.    Allergies-reviewed and updated No Known Allergies  Social History   Social History Narrative   Married. 2 sons (grown). No grandkids yet. No pets.       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       Hobbies: running and reading       Patient is right-handed. He lives in a 2 level home with his wife.   Objective  Objective:  BP 110/76   Pulse 60   Temp 98 F  (36.7 C)   Ht 5\' 8"  (1.727 m)   Wt 148 lb (67.1 kg)   SpO2 97%   BMI 22.50 kg/m  Gen: NAD, resting comfortably HEENT: Mask not removed due to covid 19. TM normal. Bridge of nose normal. Eyelids normal.  Neck: no thyromegaly or cervical lymphadenopathy  CV: RRR no murmurs rubs or gallops Lungs: CTAB no crackles, wheeze, rhonchi Abdomen: soft/nontender/nondistended/normal bowel sounds. No rebound or guarding.  Ext: no edema Skin: warm, dry Neuro: grossly normal, moves all extremities, PERRLA   Assessment and Plan  64 y.o. male presenting for annual physical.  Health Maintenance counseling: 1. Anticipatory guidance: Patient counseled regarding regular dental exams -q6 months, eye exams - q2yearly,  avoiding smoking and second hand smoke , limiting alcohol to 2 beverages per day - no alcohol .   2. Risk factor reduction:  Advised patient of need for regular exercise and diet rich and fruits and vegetables to reduce risk of heart attack and stroke. Exercise- has been able to keep up his 20 miles a week. Diet- reasonably healthy diet.  Wt Readings from Last 3 Encounters:  06/20/19 148 lb (67.1 kg)  02/26/19 150 lb 3.2 oz (68.1 kg)  02/11/19 150 lb (68 kg)  3. Immunizations/screenings/ancillary studies-  consider shingrix next year at physical - too early given recent covid vaccine. See discussion below Immunization History  Administered Date(s) Administered  . Influenza,inj,Quad PF,6+ Mos 12/10/2014  . Influenza-Unspecified 12/11/2012, 12/29/2013, 12/13/2015, 12/28/2016, 12/17/2017, 12/12/2018  . Moderna SARS-COVID-2 Vaccination 05/12/2019, 06/10/2019  . PPD Test 05/06/2014  . Td 07/08/2008  . Tdap 08/15/2018  . Typhoid Inactivated 08/22/2016    4. Prostate cancer history- check PSA today with labs. hed prefer real value- we are making a note for lab instead of just <0.1.   Lab Results  Component Value Date   PSA 0.01 (L) 02/11/2019   PSA 0.02 (L) 08/15/2018   PSA 0.02 (L)  02/04/2018   5. Colon cancer screening - 10/02/2017 with 10 year repeat 6. Skin cancer screening- no recent dermatology visit. advised regular sunscreen use. Denies worrisome, changing, or new skin lesions.  7. never smoker  Status of chronic or acute concerns   #Social update Moving to [place] in July to work in a [place] - 2 year contract -typhoid vaccine about  Years ago- due for repeat in June and that's when hed be leaving- prefers the oral which is good for 5 years and this was ordered today  From AVS:  " Wait 2 weeks from covid vaccine to do rabies vaccines  Then wait 2 weeks after rabies vaccine to do typhoid vaccine  We have reached out to Dr. Clovis Riley athletic trainer about reclast- that would likely need to be after  The above 2 vaccines "  #Paget's disease S:Patient with ongoing left ankle pain for several years- finally decided to get Dr. Clovis Riley opinion after running being cut down by pain. Ended up getting diagnosed with paget's disease- ended up not hearing about zolendronic acid - complicating this is need for several vaccines before travel including rabies, typhoid and not sure if we have time to get all these in before his trip- thankfuly running has not been as painful. He stopped higher dose of calcium and vitamin D  A/P: Thankfully patient's pain has improved somewhat.  I am going to reach out to sports medicine as he has not heard back about prior authorization for request.  Patient has not had elevated alkaline phosphatase so likely would have to trend pain or imaging-he would be back in the states to can consider repeat Reclast next year potentially Lab Results  Component Value Date   ALT 28 02/17/2019   AST 25 02/17/2019   ALKPHOS 63 02/17/2019   BILITOT 0.7 02/17/2019   #hyperlipidemia S: Patient compliant with atorvastatin 20 mg once a week Lab Results  Component Value Date   CHOL 178 02/11/2019   HDL 47.50 02/11/2019   LDLCALC 94 02/11/2019    LDLDIRECT 138.2 09/24/2009   TRIG 183.0 (H) 02/11/2019   CHOLHDL 4 02/11/2019   A/P: Most recent lipid panel looks pretty good.  Ideally we would push back cholesterol under 70 but I think this is a reasonable balance for patient.  He is traveling to [place] as above and he needs to make sure to have a year supply-will likely be home next summer-I wrote atorvastatin for 40 mg daily and instructed him to take half tablet weekly   History of prostate cancer - Plan: PSA.  In the past Friday pm labs to [facility] reported only ans <0.1-he would like more specific numbers.  We wrote in all labs for them to provide specific number if possible-otherwise he would like to return for this  #No recurrence of  dizziness from 2020  Recommended follow up: 1 year physical  Lab/Order associations: Fasting status was not indicated by team-I do not believe he was fasting   ICD-10-CM   1. Preventative health care  Z00.00 PSA    Lipid panel    Comprehensive metabolic panel    CBC with Differential/Platelet    CBC with Differential/Platelet    Comprehensive metabolic panel    Lipid panel    PSA    CANCELED: CBC with Differential/Platelet    CANCELED: Comprehensive metabolic panel    CANCELED: Lipid panel    CANCELED: PSA  2. Hyperlipidemia, unspecified hyperlipidemia type  E78.5 Comprehensive metabolic panel    CBC with Differential/Platelet    CBC with Differential/Platelet    Comprehensive metabolic panel    CANCELED: CBC with Differential/Platelet    CANCELED: Comprehensive metabolic panel  3. History of prostate cancer  Z85.46 PSA    PSA    CANCELED: PSA    Meds ordered this encounter  Medications  . typhoid (VIVOTIF) DR capsule    Sig: Take 1 capsule by mouth every other day.    Dispense:  4 capsule    Refill:  0  . atorvastatin (LIPITOR) 40 MG tablet    Sig: Take 1 tablet (40 mg total) by mouth daily.    Dispense:  30 tablet    Refill:  1    Return precautions advised.  Garret Reddish,  MD

## 2019-06-20 NOTE — Patient Instructions (Addendum)
Wait 2 weeks from covid vaccine to do rabies vaccines  Then wait 2 weeks after rabies vaccine to do typhoid vaccine  We have reached out to Dr. Clovis Riley athletic trainer about reclast- that would likely need to be after  The above 2 vaccines  Thanks for doing labs today

## 2019-06-21 LAB — UNLABELED: Test Ordered On Req: 19110

## 2019-06-23 ENCOUNTER — Encounter: Payer: Self-pay | Admitting: Family Medicine

## 2019-06-23 DIAGNOSIS — M889 Osteitis deformans of unspecified bone: Secondary | ICD-10-CM

## 2019-06-23 NOTE — Telephone Encounter (Signed)
He wanted oral instead of IM. He can certainly ask them (travel clinic) if they have oral but its usually at the pharmacy.

## 2019-06-23 NOTE — Telephone Encounter (Signed)
Call pharmacy and see if they have any oral typhoid vaccines available. Can also print this for patient and he can check at different locations to see if they have it if this doesn't work

## 2019-06-23 NOTE — Telephone Encounter (Signed)
Not sure next steps for this please advise

## 2019-06-23 NOTE — Telephone Encounter (Signed)
Is this different from what they get at the travel clinic.

## 2019-06-24 ENCOUNTER — Other Ambulatory Visit: Payer: Self-pay | Admitting: Family Medicine

## 2019-06-24 ENCOUNTER — Telehealth: Payer: Self-pay | Admitting: Family Medicine

## 2019-06-24 LAB — CBC WITH DIFFERENTIAL/PLATELET
Absolute Monocytes: 454 cells/uL (ref 200–950)
Basophils Absolute: 49 cells/uL (ref 0–200)
Basophils Relative: 0.9 %
Eosinophils Absolute: 173 cells/uL (ref 15–500)
Eosinophils Relative: 3.2 %
HCT: 45.5 % (ref 38.5–50.0)
Hemoglobin: 15.4 g/dL (ref 13.2–17.1)
Lymphs Abs: 1669 cells/uL (ref 850–3900)
MCH: 32.8 pg (ref 27.0–33.0)
MCHC: 33.8 g/dL (ref 32.0–36.0)
MCV: 96.8 fL (ref 80.0–100.0)
MPV: 9.4 fL (ref 7.5–12.5)
Monocytes Relative: 8.4 %
Neutro Abs: 3056 cells/uL (ref 1500–7800)
Neutrophils Relative %: 56.6 %
Platelets: 267 10*3/uL (ref 140–400)
RBC: 4.7 10*6/uL (ref 4.20–5.80)
RDW: 12.1 % (ref 11.0–15.0)
Total Lymphocyte: 30.9 %
WBC: 5.4 10*3/uL (ref 3.8–10.8)

## 2019-06-24 LAB — COMPREHENSIVE METABOLIC PANEL
AG Ratio: 2 (calc) (ref 1.0–2.5)
ALT: 19 U/L (ref 9–46)
AST: 18 U/L (ref 10–35)
Albumin: 4.4 g/dL (ref 3.6–5.1)
Alkaline phosphatase (APISO): 65 U/L (ref 35–144)
BUN: 16 mg/dL (ref 7–25)
CO2: 30 mmol/L (ref 20–32)
Calcium: 9.6 mg/dL (ref 8.6–10.3)
Chloride: 103 mmol/L (ref 98–110)
Creat: 0.94 mg/dL (ref 0.70–1.25)
Globulin: 2.2 g/dL (calc) (ref 1.9–3.7)
Glucose, Bld: 99 mg/dL (ref 65–99)
Potassium: 5.2 mmol/L (ref 3.5–5.3)
Sodium: 141 mmol/L (ref 135–146)
Total Bilirubin: 0.7 mg/dL (ref 0.2–1.2)
Total Protein: 6.6 g/dL (ref 6.1–8.1)

## 2019-06-24 LAB — PSA: PSA: <0.1 ng/mL (ref <=4.0)

## 2019-06-24 LAB — LIPID PANEL
Cholesterol: 170 mg/dL (ref <200)
HDL: 48 mg/dL (ref >=40)
LDL Cholesterol (Calc): 103 mg/dL (calc) — ABNORMAL HIGH
Non-HDL Cholesterol (Calc): 122 mg/dL (calc) (ref <130)
Total CHOL/HDL Ratio: 3.5 (calc) (ref <5.0)
Triglycerides: 98 mg/dL (ref <150)

## 2019-06-24 LAB — PAT ID TIQ DOC: Test Affected: 19110

## 2019-06-24 LAB — SPECIMEN ID NOTIFICATION MISSING 2ND ID

## 2019-06-24 NOTE — Telephone Encounter (Signed)
Patient is calling in, states he is returning someone's call about lab work.

## 2019-06-25 NOTE — Telephone Encounter (Signed)
Called and lm for pt tcb. 

## 2019-06-25 NOTE — Telephone Encounter (Signed)
Please investigate Reclast infusion.  This was originally ordered at [facility].

## 2019-06-25 NOTE — Progress Notes (Signed)
Notes from Dr. Yong Channel Even though we wrote specifically to comment on specific value of PSA-it was still reported as only less than 0.1.  If you would like our team can order another PSA under history of prostate cancer and do in the morning so we make sure he goes for [place] lab which I believe gives the longer readout.

## 2019-07-08 ENCOUNTER — Telehealth: Payer: Self-pay | Admitting: Family Medicine

## 2019-07-08 NOTE — Telephone Encounter (Signed)
Noted  

## 2019-07-08 NOTE — Telephone Encounter (Signed)
Per Lonn Georgia from [facility] [phone]), no clinical review needed for Reclast unless the charge is over $5,000. Reference # for this call [id number]

## 2019-07-11 NOTE — Telephone Encounter (Signed)
CMP ordered.

## 2019-07-13 ENCOUNTER — Other Ambulatory Visit: Payer: Self-pay | Admitting: Family Medicine

## 2019-07-26 ENCOUNTER — Encounter: Payer: Self-pay | Admitting: Family Medicine

## 2019-07-30 ENCOUNTER — Other Ambulatory Visit: Payer: Self-pay

## 2019-07-30 ENCOUNTER — Other Ambulatory Visit: Payer: Self-pay | Admitting: Family Medicine

## 2019-07-30 MED ORDER — TYPHOID VACCINE PO CPDR: 1.0000 | DELAYED_RELEASE_CAPSULE | ORAL | 0 refills | Status: DC

## 2019-08-03 ENCOUNTER — Encounter: Payer: Self-pay | Admitting: Family Medicine

## 2019-08-11 ENCOUNTER — Encounter: Payer: Self-pay | Admitting: Family Medicine

## 2019-08-15 ENCOUNTER — Other Ambulatory Visit: Payer: Self-pay

## 2019-08-15 ENCOUNTER — Other Ambulatory Visit (INDEPENDENT_AMBULATORY_CARE_PROVIDER_SITE_OTHER): Payer: Commercial Managed Care - PPO

## 2019-08-15 DIAGNOSIS — M889 Osteitis deformans of unspecified bone: Secondary | ICD-10-CM

## 2019-08-15 LAB — COMPREHENSIVE METABOLIC PANEL
ALT: 20 U/L (ref 0–53)
AST: 21 U/L (ref 0–37)
Albumin: 4.3 g/dL (ref 3.5–5.2)
Alkaline Phosphatase: 62 U/L (ref 39–117)
BUN: 20 mg/dL (ref 6–23)
CO2: 32 mEq/L (ref 19–32)
Calcium: 9.5 mg/dL (ref 8.4–10.5)
Chloride: 105 mEq/L (ref 96–112)
Creatinine, Ser: 0.94 mg/dL (ref 0.40–1.50)
GFR: 81.13 mL/min (ref >60.00)
Glucose, Bld: 93 mg/dL (ref 70–99)
Potassium: 4.3 mEq/L (ref 3.5–5.1)
Sodium: 140 mEq/L (ref 135–145)
Total Bilirubin: 0.4 mg/dL (ref 0.2–1.2)
Total Protein: 6.6 g/dL (ref 6.0–8.3)

## 2019-08-18 ENCOUNTER — Encounter: Payer: Commercial Managed Care - PPO | Admitting: Family Medicine

## 2019-08-18 NOTE — Progress Notes (Signed)
Labs look good.  Proceed to Reclast infusion.

## 2019-09-08 ENCOUNTER — Telehealth: Payer: Self-pay | Admitting: Family Medicine

## 2019-09-08 NOTE — Telephone Encounter (Signed)
Pt is calling to schedule is Reclast infusion, says we have done the preliminary labs.

## 2019-09-10 ENCOUNTER — Other Ambulatory Visit (HOSPITAL_COMMUNITY): Payer: Self-pay | Admitting: *Deleted

## 2019-09-11 ENCOUNTER — Ambulatory Visit (HOSPITAL_COMMUNITY)
Admission: RE | Admit: 2019-09-11 | Discharge: 2019-09-11 | Disposition: A | Discharge status: Home / Self Care | Payer: Commercial Managed Care - PPO | Source: Ambulatory Visit | Attending: Family Medicine | Admitting: Family Medicine

## 2019-09-11 ENCOUNTER — Other Ambulatory Visit: Payer: Self-pay

## 2019-09-11 DIAGNOSIS — M889 Osteitis deformans of unspecified bone: Secondary | ICD-10-CM | POA: Insufficient documentation

## 2019-09-11 MED ORDER — ZOLEDRONIC ACID 5 MG/100ML IV SOLN
INTRAVENOUS | Status: AC
  Filled 2019-09-11: qty 100

## 2019-09-11 MED ORDER — ZOLEDRONIC ACID 5 MG/100ML IV SOLN
5.0000 mg | Freq: Once | INTRAVENOUS | Status: AC
  Administered 2019-09-11: 5 mg via INTRAVENOUS

## 2019-09-11 NOTE — Discharge Instructions (Signed)

## 2019-09-22 ENCOUNTER — Encounter: Payer: Self-pay | Admitting: Family Medicine

## 2020-04-22 IMAGING — MR MR ANKLE*L* W/O CM
5 series · 40 of 40 positions shown · non-contrast
Comparison: Radiograph February 11, 2019

CLINICAL DATA: Chronic left ankle pain

EXAM:
MRI OF THE LEFT ANKLE WITHOUT CONTRAST
TECHNIQUE: Multiplanar, multisequence MR imaging of the ankle was performed. No
intravenous contrast was administered.

[Series 3: PD fat-sat · axial · 3.0mm · 0.70mm/px · z∈[-8,+127]mm · 10 of 42 slices shown]
[im 1/42]
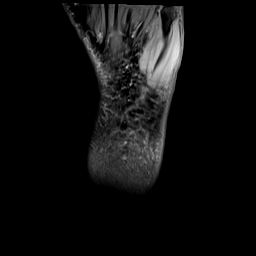
[im 5/42]
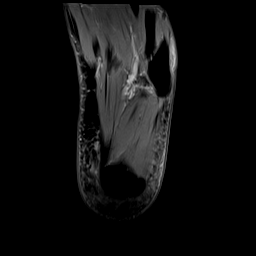
[im 10/42]
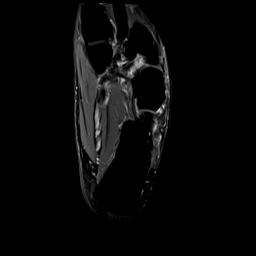
[im 14/42]
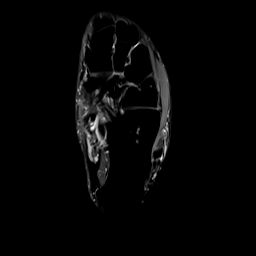
[im 19/42]
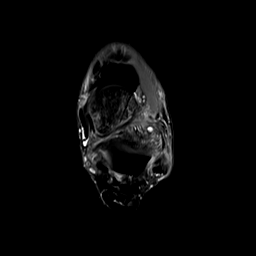
[im 23/42]
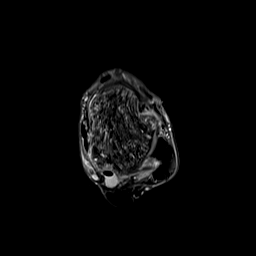
[im 28/42]
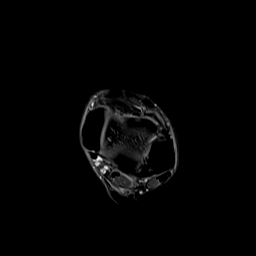
[im 32/42]
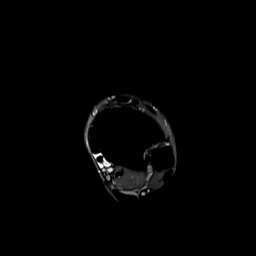
[im 37/42]
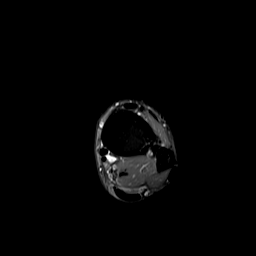
[im 42/42]
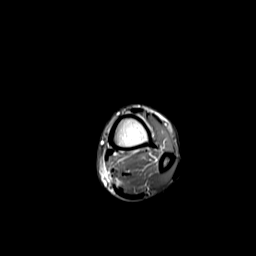

[Series 4: T2 fat-sat · axial · 3.0mm · 0.70mm/px · z∈[-8,+127]mm · 9 of 42 slices shown]
[im 1/42]
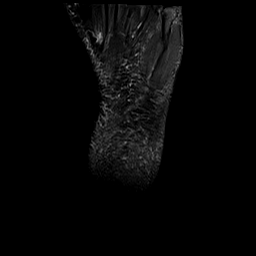
[im 6/42]
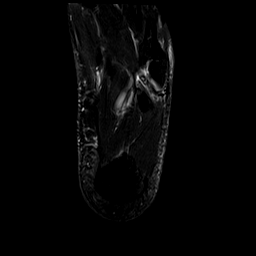
[im 11/42]
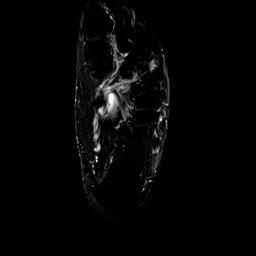
[im 16/42]
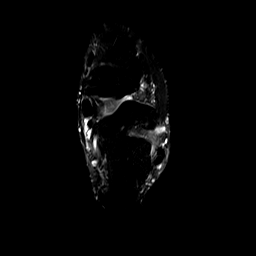
[im 21/42]
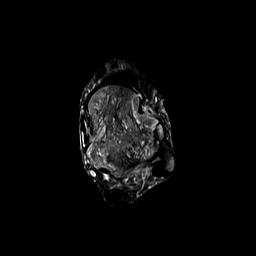
[im 26/42]
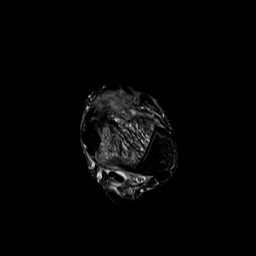
[im 31/42]
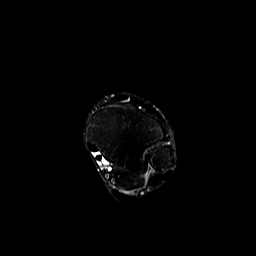
[im 36/42]
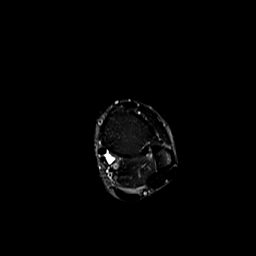
[im 42/42]
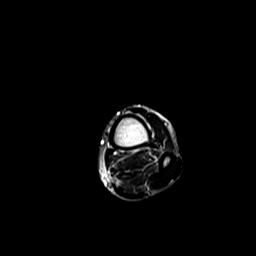

[Series 5: STIR · coronal · 3.0mm · 0.70mm/px · 10 of 45 slices shown (1 of 2)]
[im 1/45]
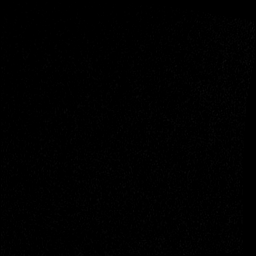
[im 5/45]
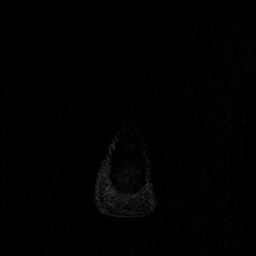
[im 10/45]
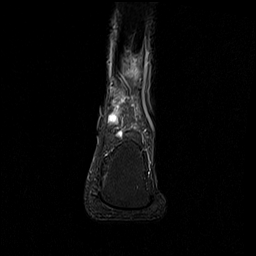
[im 15/45]
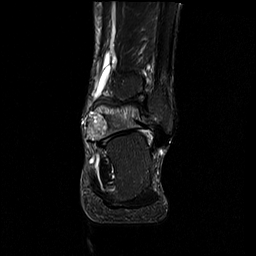
[im 20/45]
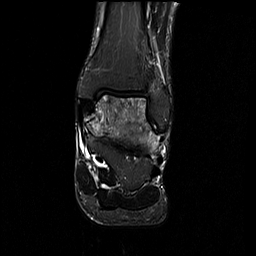
[im 25/45]
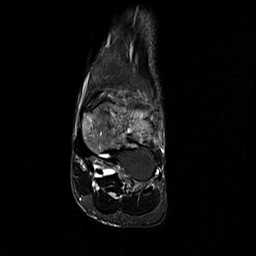
[im 30/45]
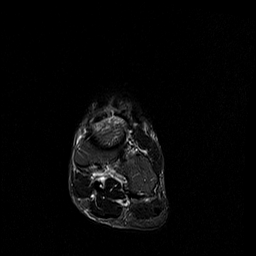
[im 35/45]
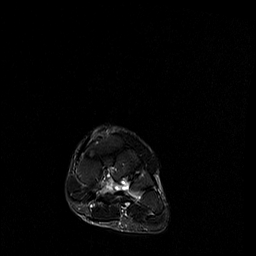
[im 40/45]
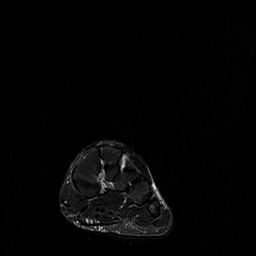
[im 45/45]
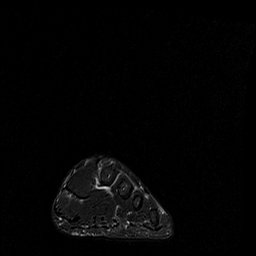

[Series 6: T1 · sagittal · 3.0mm · 0.56mm/px · 6 of 25 slices shown]
[im 1/25]
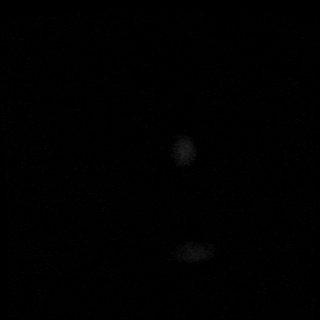
[im 5/25]
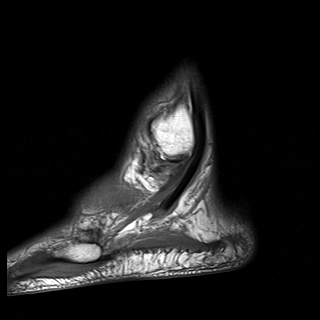
[im 10/25]
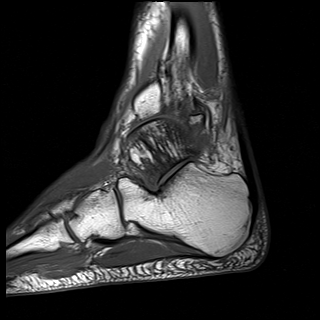
[im 15/25]
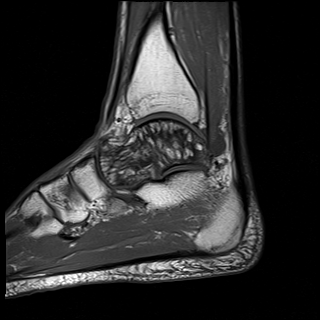
[im 20/25]
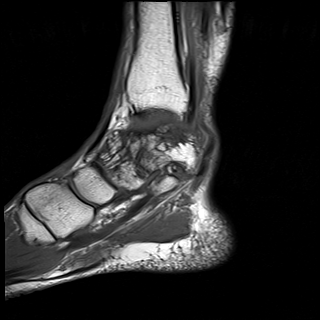
[im 25/25]
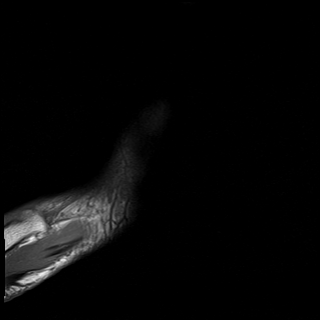

[Series 7: STIR · sagittal · 3.0mm · 0.70mm/px · 5 of 23 slices shown (2 of 2)]
[im 1/23]
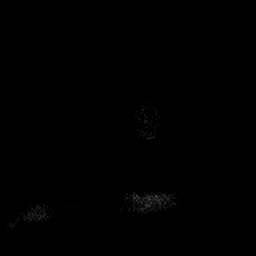
[im 6/23]
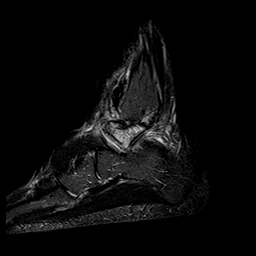
[im 12/23]
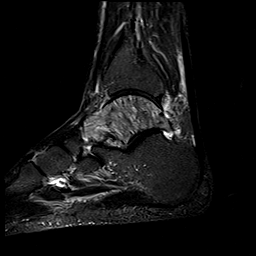
[im 17/23]
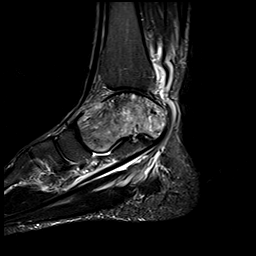
[im 23/23]
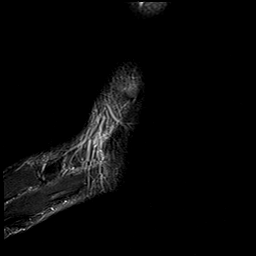

[40 of 40 positions shown; findings below may reference images not displayed]

FINDINGS: TENDONS

Peroneal: Peroneal longus tendon intact. Peroneal brevis intact.
There is a small amount of fluid seen surrounding the peroneal
tendons, however they are intact.

Posteromedial: Posterior tibial tendon intact. Flexor hallucis
longus tendon intact. Flexor digitorum longus tendon intact. There
is a small amount of fluid seen surrounding the posterior tibialis,
flexor digitorum, flexor hallucis longus tendons.

Anterior: Tibialis anterior tendon intact. Extensor hallucis longus
tendon intact Extensor digitorum longus tendon intact.

Achilles:  Intact.

Plantar Fascia: Intact.

LIGAMENTS

Lateral: There is fragmented ossicle seen adjacent to the anterior
talofibular ligament which appears to be intact. There is
heterogeneous signal seen within the posterior talofibular ligament,
however it is intact. Calcaneofibular ligament intact. Anterior and
posterior tibiofibular ligaments intact.

Medial: Deltoid ligament intact. Spring ligament intact.

CARTILAGE

Ankle Joint: No joint effusion. There is diffuse chondral thinning
seen at the tibiotalar joint.

Subtalar Joints/Sinus Tarsi: There is edema seen within the sinus
tarsi. No subtalar joint effusion. Normal sinus tarsi.

Bones: There is coarsened trabecular markings with increased T2
signal seen throughout the talus. No areas of cortical destruction
or avascular necrosis are noted. The remainder of the osseous
structures appear to be intact.

Soft Tissue: Retrocalcaneal fat pad edema is noted.
IMPRESSION: 1. Findings consistent with Paget's disease of the talus. No osseous
fracture or avascular necrosis.
2. Intrasubstance degeneration with ossification of the anterior
talofibular ligament and posterior talofibular ligament.
3. Mild tenosynovitis involving the posterior compartment and
peroneal tendons.
4. Mild tibiotalar joint osteoarthritis.

## 2020-06-21 ENCOUNTER — Telehealth: Payer: Self-pay

## 2020-06-21 NOTE — Telephone Encounter (Signed)
Are like Jessica's idea of July 1 at 10 AM-looks like she has reserved that spot-l lets fit him in

## 2020-06-21 NOTE — Telephone Encounter (Signed)
Pt is requesting a CPE the last week of June. He is currently living in [place] and only in that week. I believe a good slot could potentially be July 1st at 10 am. Please advise   ( pt is unavailable at phone number. Communicate through my chart or wife's number)

## 2020-06-21 NOTE — Telephone Encounter (Signed)
Pt is scheduled °

## 2020-06-21 NOTE — Telephone Encounter (Signed)
Pt cannot make an appointment any other time then the last week of July. He is only in the [place] for 2 weeks.

## 2020-06-21 NOTE — Telephone Encounter (Signed)
Please advise. Sent patient a my chart message, Dr.Hunter does not appear to have an opening for a physical until first week of August.

## 2020-09-15 ENCOUNTER — Encounter: Payer: Self-pay | Admitting: Family Medicine

## 2020-09-15 ENCOUNTER — Ambulatory Visit (INDEPENDENT_AMBULATORY_CARE_PROVIDER_SITE_OTHER): Payer: 59 | Admitting: Family Medicine

## 2020-09-15 ENCOUNTER — Other Ambulatory Visit: Payer: Self-pay

## 2020-09-15 VITALS — BP 116/66 | HR 57 | Temp 97.6°F | Ht 68.0 in | Wt 138.6 lb

## 2020-09-15 DIAGNOSIS — Z0000 Encounter for general adult medical examination without abnormal findings: Secondary | ICD-10-CM

## 2020-09-15 DIAGNOSIS — E785 Hyperlipidemia, unspecified: Secondary | ICD-10-CM | POA: Diagnosis not present

## 2020-09-15 DIAGNOSIS — Z8546 Personal history of malignant neoplasm of prostate: Secondary | ICD-10-CM

## 2020-09-15 DIAGNOSIS — Z20822 Contact with and (suspected) exposure to covid-19: Secondary | ICD-10-CM | POA: Diagnosis not present

## 2020-09-15 LAB — CBC WITH DIFFERENTIAL/PLATELET
Basophils Absolute: 0 10*3/uL (ref 0.0–0.1)
Basophils Relative: 0.9 % (ref 0.0–3.0)
Eosinophils Absolute: 0.2 10*3/uL (ref 0.0–0.7)
Eosinophils Relative: 3.9 % (ref 0.0–5.0)
HCT: 43 % (ref 39.0–52.0)
Hemoglobin: 14.9 g/dL (ref 13.0–17.0)
Lymphocytes Relative: 32.2 % (ref 12.0–46.0)
Lymphs Abs: 1.5 10*3/uL (ref 0.7–4.0)
MCHC: 34.8 g/dL (ref 30.0–36.0)
MCV: 96.6 fl (ref 78.0–100.0)
Monocytes Absolute: 0.5 10*3/uL (ref 0.1–1.0)
Monocytes Relative: 10.3 % (ref 3.0–12.0)
Neutro Abs: 2.4 10*3/uL (ref 1.4–7.7)
Neutrophils Relative %: 52.7 % (ref 43.0–77.0)
Platelets: 252 10*3/uL (ref 150.0–400.0)
RBC: 4.45 Mil/uL (ref 4.22–5.81)
RDW: 12.6 % (ref 11.5–15.5)
WBC: 4.5 10*3/uL (ref 4.0–10.5)

## 2020-09-15 LAB — COMPREHENSIVE METABOLIC PANEL
ALT: 24 U/L (ref 0–53)
AST: 22 U/L (ref 0–37)
Albumin: 4.5 g/dL (ref 3.5–5.2)
Alkaline Phosphatase: 46 U/L (ref 39–117)
BUN: 16 mg/dL (ref 6–23)
CO2: 31 mEq/L (ref 19–32)
Calcium: 9.2 mg/dL (ref 8.4–10.5)
Chloride: 103 mEq/L (ref 96–112)
Creatinine, Ser: 0.9 mg/dL (ref 0.40–1.50)
GFR: 90.69 mL/min (ref >60.00)
Glucose, Bld: 95 mg/dL (ref 70–99)
Potassium: 4.3 mEq/L (ref 3.5–5.1)
Sodium: 140 mEq/L (ref 135–145)
Total Bilirubin: 1 mg/dL (ref 0.2–1.2)
Total Protein: 6.7 g/dL (ref 6.0–8.3)

## 2020-09-15 LAB — LIPID PANEL
Cholesterol: 154 mg/dL (ref 0–200)
HDL: 49.2 mg/dL (ref >39.00)
LDL Cholesterol: 89 mg/dL (ref 0–99)
NonHDL: 104.36
Total CHOL/HDL Ratio: 3
Triglycerides: 78 mg/dL (ref 0.0–149.0)
VLDL: 15.6 mg/dL (ref 0.0–40.0)

## 2020-09-15 LAB — SARS-COV-2 IGG: SARS-COV-2 IgG: 24.43

## 2020-09-15 LAB — PSA: PSA: 0.02 ng/mL — ABNORMAL LOW (ref 0.10–4.00)

## 2020-09-15 MED ORDER — ATORVASTATIN CALCIUM 40 MG PO TABS: 40.0000 mg | ORAL_TABLET | Freq: Every day | ORAL | 1 refills | Status: DC

## 2020-09-15 NOTE — Patient Instructions (Addendum)
Health Maintenance Due  Topic Date Due   Zoster Vaccines- Shingrix (1 of 2) Patient differed.  Never done   COVID-19 Vaccine (3 - Booster for Moderna series) Patient will call us and update these dates for [place].  11/10/2019   Please stop by lab before you go If you have mychart- we will send your results within 3 business days of [place] receiving them.  If you do not have mychart- we will call you about results within 5 business days of [place] receiving them.  *please also note that you will see labs on mychart as soon as they post. I will later go in and write notes on them- will say "notes from Dr. Yong Channel"  Irrigation today in left ear  Recommended follow up: Return in about 1 year (around 09/15/2021) for physical or sooner if needed.

## 2020-09-15 NOTE — Progress Notes (Signed)
Phone: [phone]   Subjective:  Patient presents today for their annual physical. Chief complaint-noted.   See problem oriented charting- ROS- full  review of systems was completed and negative  except for: occasional mild tingling on both sides under chin- not very frequent- prefers to monitor  The following were reviewed and entered/updated in epic: Past Medical History:  Diagnosis Date   Hyperlipidemia 04/23/2014   Paget disease of bone 02/26/2019   Tonsillar cyst    Eval by Dr. Lucia Gaskins of ENT   Patient Active Problem List   Diagnosis Date Noted   Hyperlipidemia 04/23/2014    Priority: Medium   History of prostate cancer 03/02/2016    Priority: Low   Paget disease of bone 02/26/2019   Past Surgical History:  Procedure Laterality Date   EYE SURGERY     laser surgery for retina   VASECTOMY      Family History  Problem Relation Age of Onset   Cancer Mother        breast CA   Diabetes Mother    Cancer Father 31       liver- alcohol related   Heart attack Father 78   Diabetes Father    Diabetes Brother     Medications- reviewed and updated Current Outpatient Medications  Medication Sig Dispense Refill   atorvastatin (LIPITOR) 40 MG tablet TAKE 1 TABLET BY MOUTH EVERY DAY 90 tablet 1   Multiple Vitamin (MULTI-VITAMINS) TABS Take 1 tablet by mouth daily.     No current facility-administered medications for this visit.    Allergies-reviewed and updated No Known Allergies  Social History   Social History Narrative   Married. 2 sons (grown). No grandkids yet. No pets.       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 [employer] day [employer]- [employer].       Hobbies: running and reading       Patient is right-handed. He lives in a 2 level home with his wife.   Objective  Objective:  BP 116/66   Pulse (!) 57   Temp 97.6 F (36.4 C) (Temporal)   Ht 5\' 8"  (1.727 m)   Wt 138 lb 9.6 oz (62.9 kg)   SpO2 98%   BMI  21.07 kg/m  Gen: NAD, resting comfortably HEENT: Mucous membranes are moist. Oropharynx normal TM normal on R, on left complete occlusion- will trial irrigation Neck: no thyromegaly CV: RRR no murmurs rubs or gallops Lungs: CTAB no crackles, wheeze, rhonchi Abdomen: soft/nontender/nondistended/normal bowel sounds. No rebound or guarding.  Ext: no edema Skin: warm, dry Neuro: grossly normal, moves all extremities, PERRLA    Assessment and Plan  65 y.o. male presenting for annual physical.  Health Maintenance counseling: 1. Anticipatory guidance: Patient counseled regarding regular dental exams -q6 months, eye exams - q2yearly- went last summer,   avoiding smoking and second hand smoke , limiting alcohol to 2 beverages per day - does not drink.   2. Risk factor reduction:  Advised patient of need for regular exercise and diet rich and fruits and vegetables to reduce risk of heart attack and stroke. Exercise- harder to get run in as at a higher elevation- still 4-5 days a week doing 3-5 miles (more on 3 side at high elevation). Diet-has lost some weight working in [place]- discussed trying to avoid further weight loss- has had someone coming in to make meals recently and has been stabilizing some- also fewer snacks. Less varety  than previous Wt Readings from Last 3 Encounters:  09/15/20 138 lb 9.6 oz (62.9 kg)  09/11/19 150 lb (68 kg)  06/20/19 148 lb (67.1 kg)  3. Immunizations/screenings/ancillary studies- has had 4 covid shots- will let us know dates of last 2 . Consider shingrix when going to be in states longer Immunization History  Administered Date(s) Administered   Influenza,inj,Quad PF,6+ Mos 12/10/2014   Influenza-Unspecified 12/11/2012, 12/29/2013, 12/13/2015, 12/28/2016, 12/17/2017, 12/12/2018   Moderna Sars-Covid-2 Vaccination 05/12/2019, 06/10/2019   PPD Test 05/06/2014   Td 07/08/2008   Tdap 08/15/2018   Typhoid Inactivated 08/22/2016  4. Prostate cancer follow up -  10/22/15 robotic prostatectomy [place] doctor. Offered psa today opts in- has not seen [place] urologist recently- has considered televisit Lab Results  Component Value Date   PSA <0.1 06/20/2019   PSA 0.01 (L) 02/11/2019   PSA 0.02 (L) 08/15/2018   5. Colon cancer screening -  10/02/17 with 10 year repeat 6. Skin cancer screening- saw derm about a year ago- advised regular sunscreen use. Denies worrisome, changing, or new skin lesions.  7. never smoker 8. 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 [name] in the past- cutting down on running has helped. Prior left ankle pain- much better now.  - had one reclast injection about a year ago  A/P: we will monitor alkaline phosphatase- also he will reach out to sports med if needed- prefes to hold off fo rnow   #no recurrence of dizziness from 2020 thankfully  Recommended follow up: Return in about 1 year (around 09/15/2021) for physical or sooner if needed.  Lab/Order associations: fasting   ICD-10-CM   1. Preventative health care   Z00.00 CBC with Differential/Platelet    Comprehensive metabolic panel    Lipid panel    PSA    2. Hyperlipidemia, unspecified hyperlipidemia type  E78.5 CBC with Differential/Platelet    Comprehensive metabolic panel    Lipid panel    3. History of prostate cancer  Z85.46 PSA      No orders of the defined types were placed in this encounter.   Return precautions advised.  Garret Reddish, MD

## 2020-09-15 NOTE — Addendum Note (Signed)
Addended by: Marin Olp on: 09/15/2020 12:08 PM   Modules accepted: Orders

## 2021-09-14 ENCOUNTER — Encounter: Payer: Self-pay | Admitting: Family Medicine

## 2021-09-19 ENCOUNTER — Ambulatory Visit (INDEPENDENT_AMBULATORY_CARE_PROVIDER_SITE_OTHER): Payer: Self-pay | Admitting: Family Medicine

## 2021-09-19 ENCOUNTER — Encounter: Payer: Self-pay | Admitting: Family Medicine

## 2021-09-19 VITALS — BP 120/74 | HR 50 | Temp 97.8°F | Ht 68.0 in | Wt 144.4 lb

## 2021-09-19 DIAGNOSIS — Z0000 Encounter for general adult medical examination without abnormal findings: Secondary | ICD-10-CM

## 2021-09-19 DIAGNOSIS — R3915 Urgency of urination: Secondary | ICD-10-CM

## 2021-09-19 DIAGNOSIS — Z8546 Personal history of malignant neoplasm of prostate: Secondary | ICD-10-CM

## 2021-09-19 DIAGNOSIS — E785 Hyperlipidemia, unspecified: Secondary | ICD-10-CM

## 2021-09-19 LAB — POC URINALSYSI DIPSTICK (AUTOMATED)
Bilirubin, UA: NEGATIVE
Blood, UA: NEGATIVE
Glucose, UA: NEGATIVE
Ketones, UA: NEGATIVE
Leukocytes, UA: NEGATIVE
Nitrite, UA: NEGATIVE
Protein, UA: NEGATIVE
Spec Grav, UA: 1.01
Urobilinogen, UA: 0.2 U/dL
pH, UA: 5

## 2021-09-19 NOTE — Patient Instructions (Addendum)
Health Maintenance Due  Topic Date Due   Zoster Vaccines- Shingrix (1 of 2) Never done  Do this once you get medicare at pharmacy and let us know  Schedule a lab visit at the check out desk for after 66th birthday/once medicare kicks in. Return for future fasting labs meaning nothing but water after midnight please. Ok to take your medications with water.  - we will still process urine today  Recommended follow up: Return in about 1 year (around 09/20/2022) for physical or sooner if needed.Schedule b4 you leave.

## 2021-09-19 NOTE — Progress Notes (Signed)
Phone: [phone]   Subjective:  Patient presents today for their annual follow up (self pay). Chief complaint-noted.   See problem oriented charting- ROS- full  review of systems was completed and negative  except for: urinary urgency with running  The following were reviewed and entered/updated in epic: Past Medical History:  Diagnosis Date   Hyperlipidemia 04/23/2014   Paget disease of bone 02/26/2019   Tonsillar cyst    Eval by Dr. Lucia Gaskins of ENT   Patient Active Problem List   Diagnosis Date Noted   Hyperlipidemia 04/23/2014    Priority: Medium    Paget disease of bone 02/26/2019    Priority: Low   History of prostate cancer 03/02/2016    Priority: Low   Past Surgical History:  Procedure Laterality Date   EYE SURGERY     laser surgery for retina   VASECTOMY     Family History  Problem Relation Age of Onset   Cancer Mother        breast CA   Diabetes Mother    Cancer Father 40       liver- alcohol related   Heart attack Father 25   Diabetes Father    Diabetes Brother    CAD Brother        too long for stents    Medications- reviewed and updated Current Outpatient Medications  Medication Sig Dispense Refill   atorvastatin (LIPITOR) 40 MG tablet Take 1 tablet (40 mg total) by mouth daily. 90 tablet 1   Multiple Vitamin (MULTI-VITAMINS) TABS Take 1 tablet by mouth daily.     No current facility-administered medications for this visit.    Allergies-reviewed and updated No Known Allergies  Social History   Social History Narrative   Married. 2 sons (oldest working in a private club inside [place], younger working with 3d priting- working on joints and replacements). No grandkids yet. No pets.       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 [employer].       Hobbies: running and reading       Patient is right-handed. He lives in a 2 level home with his wife.   Objective  Objective:   BP 120/74   Pulse (!) 50   Temp 97.8 F (36.6 C)   Ht '5\' 8"'$  (1.727 m)   Wt 144 lb 6.4 oz (65.5 kg)   SpO2 97%   BMI 21.96 kg/m  Gen: NAD, resting comfortably HEENT: Mucous membranes are moist. Oropharynx normal Neck: no thyromegaly CV: RRR no murmurs rubs or gallops Lungs: CTAB no crackles, wheeze, rhonchi Abdomen: soft/nontender/nondistended/normal bowel sounds. No rebound or guarding.  Ext: no edema Skin: warm, dry Neuro: grossly normal, moves all extremities, PERRLA   Assessment and Plan  66 y.o. male presenting for annual follow up (self pay) Health Maintenance counseling: 1. Anticipatory guidance: Patient counseled regarding regular dental exams -q6 months, eye exams - q 2 yearly,  avoiding smoking and second hand smoke , limiting alcohol to 2 beverages per day - does not drink, no illicit drugs .   2. Risk factor reduction:  Advised patient of need for regular exercise and diet rich and fruits and vegetables to reduce risk of heart attack and stroke.  Exercise- 6 days a week right now 3-5 miles.  Diet/weight management-healthy weight- reasonably healthy diet.  Wt Readings from Last 3 Encounters:  09/19/21 144 lb 6.4 oz (65.5 kg)  09/15/20 138  lb 9.6 oz (62.9 kg)  09/11/19 150 lb (68 kg)  3. Immunizations/screenings/ancillary studies- had covid in January- recommended waiting on newer vaccination, shingrix #1 originally planned but going on medicare soon and we opted to wait to have coverage at pharmacy Immunization History  Administered Date(s) Administered   Influenza,inj,Quad PF,6+ Mos 12/10/2014   Influenza-Unspecified 12/11/2012, 12/29/2013, 12/13/2015, 12/28/2016, 12/17/2017, 12/12/2018   Moderna Sars-Covid-2 Vaccination 05/12/2019, 06/10/2019, 02/03/2020, 09/03/2020   PPD Test 05/06/2014   Td 07/08/2008   Tdap 08/15/2018   Typhoid Inactivated 08/22/2016  4. Prostate cancer screening-  history of prostate cancer- we monitor his PSAs after prostatectomy in [place]  10/22/15. Consider follow up with [facility] urologist once gets on medicare Lab Results  Component Value Date   PSA 0.02 (L) 09/15/2020   PSA <0.1 06/20/2019   PSA 0.01 (L) 02/11/2019   5. Colon cancer screening - 10/02/17 with 10 year repeat 6. Skin cancer screening- consider going back to dermatology once medicare starts. advised regular sunscreen use. Denies worrisome, changing, or new skin lesions.  7. Smoking associated screening (lung cancer screening, AAA screen 65-75, UA)- never smoker 8. STD screening - only active with wife- not needed  Status of chronic or acute concerns   #hyperlipidemia S: Medication: atorvastatin 40 mg daily listed but taking weekly - no side effects - dad with MI in 44s (obese different lifestyle). Also found out about brother Linna Hoff with CAD recently 68 Lab Results  Component Value Date   CHOL 154 09/15/2020   HDL 49.20 09/15/2020   LDLCALC 89 09/15/2020   LDLDIRECT 138.2 09/24/2009   TRIG 78.0 09/15/2020   CHOLHDL 3 09/15/2020   A/P: LDL reasonably controlled but with family history we opted for ct cardiac scoring to help guide if we need to be even more aggressive with therapy  #Balance concerns- had issues with vertigo several years ago.  From 2020 note "Had dizziness episode several months ago- we thought low risk stroke/TIA overall. Referred to neurology and had virtual visit. Some head positioning related to it- BPPV thought possibility. Saw neurology and thought to still likely be inner ear related- possibly allergy related but perhaps not BPPV. Had not had recurrence in 2 months so they opted to monitor and hold off on MRI" -shopping a few weeks ago felt slightly off balance- not a persistent issues - we discussed if had more persistent issues consider further workup- like MRI which we held off on in consultation with neurology several years ago  #Varicose veins-noted on the front of his right leg-wears compression on the left leg (from old running  injury) and asked about wearing on both sides-this is certainly reasonable.    #urinary urgency with running mainly - will get UA and culture to be sure no infection. Low risk std as monogamous. 6 months of issues. Also suggested double voiding before running. Some baseline incontinence issues after prostate surgery  #Questions from my chart were addressed   Recommended follow up: Return in about 1 year (around 09/20/2022) for followup or sooner if needed.Schedule b4 you leave.  Lab/Order associations: fasting   ICD-10-CM   1. Hyperlipidemia, unspecified hyperlipidemia type  E78.5 CBC with Differential/Platelet    Comprehensive metabolic panel    Lipid panel    CT CARDIAC SCORING (SELF PAY ONLY)    2. Preventative health care  Z00.00     3. History of prostate cancer  Z85.46 PSA    4. Urinary urgency  R39.15 POCT Urinalysis Dipstick (Automated)  Urine Culture     Time Spent: 36 minutes of total time (8:10 AM- 8:46 AM) was spent on the date of the encounter performing the following actions: chart review prior to seeing the patient, obtaining history, performing a medically necessary exam, counseling on the treatment plan, placing orders, and documenting in our EHR.   Return precautions advised.  Garret Reddish, MD

## 2021-09-20 LAB — URINE CULTURE
MICRO NUMBER:: 13624824
Result:: NO GROWTH
SPECIMEN QUALITY:: ADEQUATE

## 2021-11-28 ENCOUNTER — Other Ambulatory Visit (INDEPENDENT_AMBULATORY_CARE_PROVIDER_SITE_OTHER): Payer: Medicare Other

## 2021-11-28 ENCOUNTER — Ambulatory Visit: Payer: Medicare Other | Admitting: Family Medicine

## 2021-11-28 DIAGNOSIS — E785 Hyperlipidemia, unspecified: Secondary | ICD-10-CM

## 2021-11-28 DIAGNOSIS — Z8546 Personal history of malignant neoplasm of prostate: Secondary | ICD-10-CM | POA: Diagnosis not present

## 2021-11-28 LAB — CBC WITH DIFFERENTIAL/PLATELET
Basophils Absolute: 0 10*3/uL (ref 0.0–0.1)
Basophils Relative: 1 % (ref 0.0–3.0)
Eosinophils Absolute: 0.2 10*3/uL (ref 0.0–0.7)
Eosinophils Relative: 5.5 % — ABNORMAL HIGH (ref 0.0–5.0)
HCT: 46.2 % (ref 39.0–52.0)
Hemoglobin: 15.8 g/dL (ref 13.0–17.0)
Lymphocytes Relative: 27.7 % (ref 12.0–46.0)
Lymphs Abs: 1.2 10*3/uL (ref 0.7–4.0)
MCHC: 34.2 g/dL (ref 30.0–36.0)
MCV: 97.4 fl (ref 78.0–100.0)
Monocytes Absolute: 0.5 10*3/uL (ref 0.1–1.0)
Monocytes Relative: 11.5 % (ref 3.0–12.0)
Neutro Abs: 2.4 10*3/uL (ref 1.4–7.7)
Neutrophils Relative %: 54.3 % (ref 43.0–77.0)
Platelets: 224 10*3/uL (ref 150.0–400.0)
RBC: 4.75 Mil/uL (ref 4.22–5.81)
RDW: 13.1 % (ref 11.5–15.5)
WBC: 4.4 10*3/uL (ref 4.0–10.5)

## 2021-11-28 LAB — LIPID PANEL
Cholesterol: 174 mg/dL (ref 0–200)
HDL: 52.9 mg/dL (ref >39.00)
LDL Cholesterol: 106 mg/dL — ABNORMAL HIGH (ref 0–99)
NonHDL: 121.01
Total CHOL/HDL Ratio: 3
Triglycerides: 77 mg/dL (ref 0.0–149.0)
VLDL: 15.4 mg/dL (ref 0.0–40.0)

## 2021-11-28 LAB — COMPREHENSIVE METABOLIC PANEL
ALT: 19 U/L (ref 0–53)
AST: 19 U/L (ref 0–37)
Albumin: 4.3 g/dL (ref 3.5–5.2)
Alkaline Phosphatase: 46 U/L (ref 39–117)
BUN: 22 mg/dL (ref 6–23)
CO2: 28 mEq/L (ref 19–32)
Calcium: 9.6 mg/dL (ref 8.4–10.5)
Chloride: 105 mEq/L (ref 96–112)
Creatinine, Ser: 1.03 mg/dL (ref 0.40–1.50)
GFR: 76.49 mL/min (ref >60.00)
Glucose, Bld: 90 mg/dL (ref 70–99)
Potassium: 4.5 mEq/L (ref 3.5–5.1)
Sodium: 143 mEq/L (ref 135–145)
Total Bilirubin: 0.8 mg/dL (ref 0.2–1.2)
Total Protein: 6.7 g/dL (ref 6.0–8.3)

## 2021-11-28 LAB — PSA: PSA: 0.01 ng/mL — ABNORMAL LOW (ref 0.10–4.00)

## 2021-11-29 ENCOUNTER — Other Ambulatory Visit: Payer: Self-pay

## 2021-12-05 ENCOUNTER — Encounter: Payer: Self-pay | Admitting: *Deleted

## 2022-01-17 ENCOUNTER — Ambulatory Visit
Admission: RE | Admit: 2022-01-17 | Discharge: 2022-01-17 | Disposition: A | Discharge status: Home / Self Care | Payer: No Typology Code available for payment source | Source: Ambulatory Visit | Attending: Family Medicine | Admitting: Family Medicine

## 2022-01-17 DIAGNOSIS — E785 Hyperlipidemia, unspecified: Secondary | ICD-10-CM

## 2022-01-18 ENCOUNTER — Encounter: Payer: Self-pay | Admitting: Family Medicine

## 2022-01-19 ENCOUNTER — Other Ambulatory Visit: Payer: Self-pay

## 2022-01-19 DIAGNOSIS — R931 Abnormal findings on diagnostic imaging of heart and coronary circulation: Secondary | ICD-10-CM

## 2022-01-19 MED ORDER — ATORVASTATIN CALCIUM 40 MG PO TABS: 40.0000 mg | ORAL_TABLET | Freq: Every day | ORAL | 3 refills | Status: DC

## 2022-01-24 ENCOUNTER — Encounter: Payer: Self-pay | Admitting: Family Medicine

## 2022-01-24 ENCOUNTER — Ambulatory Visit (INDEPENDENT_AMBULATORY_CARE_PROVIDER_SITE_OTHER): Payer: Medicare Other | Admitting: Family Medicine

## 2022-01-24 VITALS — BP 108/70 | HR 66 | Temp 98.1°F | Ht 68.0 in | Wt 143.0 lb

## 2022-01-24 DIAGNOSIS — R931 Abnormal findings on diagnostic imaging of heart and coronary circulation: Secondary | ICD-10-CM

## 2022-01-24 DIAGNOSIS — E785 Hyperlipidemia, unspecified: Secondary | ICD-10-CM | POA: Diagnosis not present

## 2022-01-24 DIAGNOSIS — Z23 Encounter for immunization: Secondary | ICD-10-CM

## 2022-01-24 NOTE — Addendum Note (Signed)
Addended by: Clyde Lundborg A on: 01/24/2022 02:43 PM   Modules accepted: Orders

## 2022-01-24 NOTE — Patient Instructions (Addendum)
Prevnar 20 today  Please check with your pharmacy to see if they have the shingrix vaccine. If they do- please get this immuniPzation and update [place] by phone call or mychart with dates you receive the vaccine  Recommended follow up: Return in about 6 months (around 07/25/2022) for welcome to medicare.

## 2022-01-24 NOTE — Progress Notes (Signed)
  Phone [phone] In person visit   Subjective:   Ronnie Hawkins is a 66 y.o. year old very pleasant male patient who presents for/with See problem oriented charting Chief Complaint  Patient presents with   Follow-up   Hyperlipidemia    Pt would like to discuss score of CT scoring.    Past Medical History-  Patient Active Problem List   Diagnosis Date Noted   Agatston coronary artery calcium score greater than 400 01/24/2022    Priority: High   Hyperlipidemia 04/23/2014    Priority: Medium    Paget disease of bone 02/26/2019    Priority: Low   History of prostate cancer 03/02/2016    Priority: Low    Medications- reviewed and updated Current Outpatient Medications  Medication Sig Dispense Refill   atorvastatin (LIPITOR) 40 MG tablet Take 1 tablet (40 mg total) by mouth daily. 90 tablet 3   Multiple Vitamin (MULTI-VITAMINS) TABS Take 1 tablet by mouth daily.     No current facility-administered medications for this visit.     Objective:  BP 108/70   Pulse 66   Temp 98.1 F (36.7 C)   Ht '5\' 8"'$  (1.727 m)   Wt 143 lb (64.9 kg)   SpO2 96%   BMI 21.74 kg/m  Gen: NAD, resting comfortably CV: RRR no murmurs rubs or gallops Lungs: CTAB no crackles, wheeze, rhonchi Abdomen: soft/nontender/nondistended/normal bowel sounds. No rebound or guarding.  Ext: no edema under compression Skin: warm, dry    Assessment and Plan   #CT calcium scoring-514 on 01/17/2022 which is 83rd percentile #hyperlipidemia S: Medication: Atorvastatin 40 mg increased to daily from once weekly after elevated CT calcium scoring noted -warned of prediabetes risks Lab Results  Component Value Date   CHOL 174 11/28/2021   HDL 52.90 11/28/2021   LDLCALC 106 (H) 11/28/2021   LDLDIRECT 138.2 09/24/2009   TRIG 77.0 11/28/2021   CHOLHDL 3 11/28/2021   A/P: lipids likely improving after changing statin- we will plan on rechecking next visit if not done by cardiology -asymptomatic from elevated ct  calcium scoring/plaque- continue risk factor modification- hold off on aspirin unless cardiology recommends.  -has some interest in changing to rosuvastatin since does not cross blood brain barrier as much and may reduce risk of memory loss some but wants to thin it over/possibly chat with cardiology  Recommended follow up: Return in about 6 months (around 07/25/2022) for welcome to medicare. Future Appointments  Date Time Provider [place]  02/08/2022  2:40 PM Buford Dresser, MD [facility]  09/21/2022  8:00 AM Marin Olp, MD [facility]   Lab/Order associations:   ICD-10-CM   1. Agatston coronary artery calcium score greater than 400  R93.1     2. Hyperlipidemia, unspecified hyperlipidemia type  E78.5      No orders of the defined types were placed in this encounter.  Return precautions advised.  Garret Reddish, MD

## 2022-02-08 ENCOUNTER — Ambulatory Visit (INDEPENDENT_AMBULATORY_CARE_PROVIDER_SITE_OTHER): Payer: Medicare Other | Admitting: Cardiology

## 2022-02-08 ENCOUNTER — Encounter (HOSPITAL_BASED_OUTPATIENT_CLINIC_OR_DEPARTMENT_OTHER): Payer: Self-pay | Admitting: Cardiology

## 2022-02-08 VITALS — BP 128/84 | HR 64 | Ht 68.0 in | Wt 142.8 lb

## 2022-02-08 DIAGNOSIS — Z789 Other specified health status: Secondary | ICD-10-CM

## 2022-02-08 DIAGNOSIS — Z712 Person consulting for explanation of examination or test findings: Secondary | ICD-10-CM | POA: Diagnosis not present

## 2022-02-08 DIAGNOSIS — Z7189 Other specified counseling: Secondary | ICD-10-CM

## 2022-02-08 DIAGNOSIS — I2510 Atherosclerotic heart disease of native coronary artery without angina pectoris: Secondary | ICD-10-CM | POA: Diagnosis not present

## 2022-02-08 MED ORDER — ASPIRIN 81 MG PO TBEC: 81.0000 mg | DELAYED_RELEASE_TABLET | Freq: Every day | ORAL | 3 refills | Status: AC

## 2022-02-08 MED ORDER — ROSUVASTATIN CALCIUM 10 MG PO TABS: 10.0000 mg | ORAL_TABLET | Freq: Every day | ORAL | 3 refills | Status: DC

## 2022-02-08 NOTE — Progress Notes (Signed)
Cardiology Office Note:    Date:  02/08/2022   ID:  Ronnie Hawkins, DOB 1955-10-31, MRN [id number]  PCP:  Marin Olp, MD  Cardiologist:  Buford Dresser, MD  Referring MD: Marin Olp, MD   CC: new patient consultation for elevated coronary calcium score  History of Present Illness:    Ronnie Hawkins is a 66 y.o. male with a hx of hyperlipidemia and Paget disease who is seen as a new consult at the request of Marin Olp, MD for the evaluation and management of elevated coronary artery calcium score.  He was initially seen by Dr. Yong Channel  on 01/24/2022 for hyperlipidemia follow-up. His atorvastatin was increased to 40 mg daily.  Today, he is accompanied by his wife. He has been experiencing dizziness, lightheadedness, and nausea for the last week and half.  Over the last 2 years, he previously had episodes where he felt like his heart beat wasn't regular and was skipping a beat.   He discusses having enlarged veins in his bilateral lower extremities.  Cardiovascular risk factors:  Prior clinical ASCVD: none Comorbid conditions: Hyperlipidemia - Since age 29, his cholesterol has averaged around 200. He was initially prescribed 10 mg atorvastatin a week, but his dosage was increased to 20 mg. He experienced side effects such as nausea and lightheadedness. He recalls taking baby aspirin up until age 50. Family history: His brother had a MI. Prior pertinent testing and/or incidental findings: We discussed his calcium score of 513 at great length. Exercise level: For exercise, he runs 20-25 miles a week, with no symptoms. Current diet: For his diet, he regularly consumes fruit and grains. For protein, he only eats chicken once a day. He does not consume green vegetables. He takes a multivitamin. He watches his sweet intake. He consumes plenty of calcium through regular milk intake for example.  He denies, hematuria, night sweats,  and hearing loss.  He denies any  chest pain, shortness of breath, or peripheral edema. No headaches, syncope, orthopnea, or PND.   Past Medical History:  Diagnosis Date   Hyperlipidemia 04/23/2014   Paget disease of bone 02/26/2019   Tonsillar cyst    Eval by Dr. Lucia Gaskins of ENT    Past Surgical History:  Procedure Laterality Date   EYE SURGERY     laser surgery for retina   VASECTOMY      Current Medications: Current Outpatient Medications on File Prior to Visit  Medication Sig   atorvastatin (LIPITOR) 40 MG tablet Take 1 tablet (40 mg total) by mouth daily.   Multiple Vitamin (MULTI-VITAMINS) TABS Take 1 tablet by mouth daily.   No current facility-administered medications on file prior to visit.     Allergies:   Patient has no known allergies.   Social History   Tobacco Use   Smoking status: Never   Smokeless tobacco: Never  Substance Use Topics   Alcohol use: No    Alcohol/week: 0.0 standard drinks of alcohol   Drug use: No    Family History: family history includes CAD in his brother; Cancer in his mother; Cancer (age of onset: 43) in his father; Diabetes in his brother, father, and mother; Heart attack (age of onset: 44) in his father.  ROS:   Please see the history of present illness.  Additional pertinent ROS: Constitutional: Negative for chills, fever, night sweats, unintentional weight loss  HENT: Negative for ear pain and hearing loss.   Eyes: Negative for loss of vision and eye pain.  Respiratory: Negative for cough, sputum, wheezing.   Cardiovascular: Positive for palpitations, See HPI. Gastrointestinal: Positive for nausea, Negative for abdominal pain, melena, and hematochezia.  Genitourinary: Negative for dysuria and hematuria.  Musculoskeletal: Negative for falls and myalgias.  Skin: Negative for itching and rash.  Neurological: Positive for lightheadedness, Negative for focal weakness, focal sensory changes and loss of consciousness.  Endo/Heme/Allergies: Positive for enlarged  veins in bilateral lower extremities. Does not bruise/bleed easily.     EKGs/Labs/Other Studies Reviewed:    The following studies were reviewed today:  CT Cardiac Scoring 01/17/2022:   OTHER FINDINGS:   Heart size is normal. Visualized mediastinal structures are normal. Images of the upper abdomen are unremarkable. No airspace disease or consolidation in the visualized lungs. Two tiny pleural-based calcifications in the posterior right lower lobe on image 30/9. No acute bone abnormality.   IMPRESSION: Coronary calcium score is 514 and this is at percentile 83 for subjects of the same age, gender and ethnicity.  EKG:  EKG is personally reviewed.   02/08/2022: NSR at 64 bpm with iRBBB  Recent Labs: 11/28/2021: ALT 19; BUN 22; Creatinine, Ser 1.03; Hemoglobin 15.8; Platelets 224.0; Potassium 4.5; Sodium 143  Recent Lipid Panel    Component Value Date/Time   CHOL 174 11/28/2021 0733   TRIG 77.0 11/28/2021 0733   HDL 52.90 11/28/2021 0733   CHOLHDL 3 11/28/2021 0733   VLDL 15.4 11/28/2021 0733   LDLCALC 106 (H) 11/28/2021 0733   LDLCALC 103 (H) 06/20/2019 1649   LDLDIRECT 138.2 09/24/2009 0832    Physical Exam:    VS:  BP 128/84   Pulse 64   Ht '5\' 8"'$  (1.727 m)   Wt 142 lb 12.8 oz (64.8 kg)   SpO2 97%   BMI 21.71 kg/m     Wt Readings from Last 3 Encounters:  02/08/22 142 lb 12.8 oz (64.8 kg)  01/24/22 143 lb (64.9 kg)  09/19/21 144 lb 6.4 oz (65.5 kg)    GEN: Well nourished, well developed in no acute distress HEENT: Normal, moist mucous membranes NECK: No JVD CARDIAC: regular rhythm, normal S1 and S2, no rubs or gallops. No murmur. VASCULAR: Radial and DP pulses 2+ bilaterally. No carotid bruits RESPIRATORY:  Clear to auscultation without rales, wheezing or rhonchi  ABDOMEN: Soft, non-tender, non-distended MUSCULOSKELETAL:  Ambulates independently SKIN: Warm and dry, no edema NEUROLOGIC:  Alert and oriented x 3. No focal neuro deficits noted. PSYCHIATRIC:   Normal affect    ASSESSMENT:    1. Nonocclusive coronary atherosclerosis of native coronary artery   2. Encounter to discuss test results   3. Statin intolerance   4. Cardiac risk counseling   5. Counseling on health promotion and disease prevention    PLAN:    Elevated coronary calcium score, consistent with CAD -no symptoms with activity -reviewed his test results, recommendations -has had issues with statin intolerance. Will change atorvastatin to rosuvastatin, monitor symptoms. Recheck labs in 3 mos -discussed recommendations for aspirin, he is amenable -discussed red flag warning signs that need immediate medical attention -discussed potential exercise treadmill stress given elevated calcium score. Given his high level of activity, we will instead monitor for any decreased exercise tolerance or development of symptoms -check lp(a) given high calcium score  Cardiac risk counseling and prevention recommendations: -recommend heart healthy/Mediterranean diet, with whole grains, fruits, vegetable, fish, lean meats, nuts, and olive oil. Limit salt. -recommend moderate walking, 3-5 times/week for 30-50 minutes each session. Aim for at least 150 minutes.week. Goal  should be pace of 3 miles/hours, or walking 1.5 miles in 30 minutes -recommend avoidance of tobacco products. Avoid excess alcohol. -ASCVD risk score: The 10-year ASCVD risk score (Arnett DK, et al., 2019) is: 11.2%   Values used to calculate the score:     Age: 30 years     Sex: Male     Is Non-Hispanic African American: No     Diabetic: No     Tobacco smoker: No     Systolic Blood Pressure: 277 mmHg     Is BP treated: No     HDL Cholesterol: 52.9 mg/dL     Total Cholesterol: 174 mg/dL    Plan for follow up: 1 year  Buford Dresser, MD, PhD, [facility]    Medication Adjustments/Labs and Tests Ordered: Current medicines are reviewed at length with the patient today.  Concerns regarding  medicines are outlined above.  Orders Placed This Encounter  Procedures   Lipoprotein A (LPA)   Lipid panel   Hepatic function panel   EKG 12-Lead   Meds ordered this encounter  Medications   aspirin EC 81 MG tablet    Sig: Take 1 tablet (81 mg total) by mouth daily. Swallow whole.    Dispense:  90 tablet    Refill:  3   rosuvastatin (CRESTOR) 10 MG tablet    Sig: Take 1 tablet (10 mg total) by mouth daily.    Dispense:  90 tablet    Refill:  3   Patient Instructions  Medication Instructions:   STOP Atorvastatin  START Rosuvastatin 10 mg daily  START Aspirin '81mg'$  daily  *If you need a refill on your cardiac medications before your next appointment, please call your pharmacy*   Lab Work: Your physician recommends that you return for lab work in 3 months: LPA, Lipid profile, Liver Function Test   If you have labs (blood work) drawn today and your tests are completely normal, you will receive your results only by: MyChart Message (if you have MyChart) OR A paper copy in the mail If you have any lab test that is abnormal or we need to change your treatment, we will call you to review the results.   Follow-Up: At [facility], you and your health needs are our priority.  As part of our continuing mission to provide you with exceptional heart care, we have created designated Provider Care Teams.  These Care Teams include your primary Cardiologist (physician) and Advanced Practice Providers (APPs -  Physician Assistants and Nurse Practitioners) who all work together to provide you with the care you need, when you need it.  We recommend signing up for the patient portal called "MyChart".  Sign up information is provided on this After Visit Summary.  MyChart is used to connect with patients for Virtual Visits (Telemedicine).  Patients are able to view lab/test results, encounter notes, upcoming appointments, etc.  Non-urgent messages can be sent to your provider as well.    To learn more about what you can do with MyChart, go to [email or web address].    Your next appointment:   1 year(s)  The format for your next appointment:   In Person  Provider:   Buford Dresser, MD    Other Instructions  Recent data, summarized from [place] from a study from the [facility]:  https://wilkins.info/  "Researchers at the [facility] set out to answer this question by comparing statins to supplements in a clinical trial. They tracked the  outcomes of 190 adults, ages 68 to 3. Some participants were given a 5 mg daily dose of rosuvastatin, a statin that is sold under the brand name Crestor for 28 days. Others were given supplements, including fish oil, cinnamon, garlic, turmeric, plant sterols or red yeast rice for the same period."  "What we found was that rosuvastatin lowered LDL cholesterol by almost 38% and that was vastly superior to placebo and any of the six supplements studied in the trial," study Despina Hidden, M.D. of the [facility] [place] told NPR. He says this level of reduction is enough to lower the risk of heart attacks and strokes. The findings are published in the Journal of the [employer] of Cardiology.  "Oftentimes these supplements are marketed as 'natural ways' to lower your cholesterol," says Laffin. But he says none of the dietary supplements demonstrated any significant decrease in LDL cholesterol compared with a placebo. LDL cholesterol is considered the 'bad cholesterol' because it can contribute to plaque build-up in the artery walls - which can narrow the arteries, and set the stage for heart attacks and strokes"    I,Mitra Faeizi,acting as a scribe for [employer], MD.,have documented all relevant documentation on the behalf of Buford Dresser, MD,as  directed by  Buford Dresser, MD while in the presence of Buford Dresser, MD.   I, Buford Dresser, MD, have reviewed all documentation for this visit. The documentation on 04/16/22 for the exam, diagnosis, procedures, and orders are all accurate and complete.   Signed, Buford Dresser, MD PhD 02/08/2022     [place]

## 2022-02-08 NOTE — Patient Instructions (Signed)
Medication Instructions:   STOP Atorvastatin  START Rosuvastatin 10 mg daily  START Aspirin '81mg'$  daily  *If you need a refill on your cardiac medications before your next appointment, please call your pharmacy*   Lab Work: Your physician recommends that you return for lab work in 3 months: LPA, Lipid profile, Liver Function Test   If you have labs (blood work) drawn today and your tests are completely normal, you will receive your results only by: [place] (if you have MyChart) OR A paper copy in the mail If you have any lab test that is abnormal or we need to change your treatment, we will call you to review the results.   Follow-Up: At [facility], you and your health needs are our priority.  As part of our continuing mission to provide you with exceptional heart care, we have created designated Provider Care Teams.  These Care Teams include your primary Cardiologist (physician) and Advanced Practice Providers (APPs -  Physician Assistants and Nurse Practitioners) who all work together to provide you with the care you need, when you need it.  We recommend signing up for the patient portal called "MyChart".  Sign up information is provided on this After Visit Summary.  MyChart is used to connect with patients for Virtual Visits (Telemedicine).  Patients are able to view lab/test results, encounter notes, upcoming appointments, etc.  Non-urgent messages can be sent to your provider as well.   To learn more about what you can do with MyChart, go to [email or web address].    Your next appointment:   1 year(s)  The format for your next appointment:   In Person  Provider:   Buford Dresser, MD    Other Instructions  Recent data, summarized from [place] from a study from the [facility]:  https://wilkins.info/  "Researchers at the  [facility] set out to answer this question by comparing statins to supplements in a clinical trial. They tracked the outcomes of 190 adults, ages 59 to 36. Some participants were given a 5 mg daily dose of rosuvastatin, a statin that is sold under the brand name Crestor for 28 days. Others were given supplements, including fish oil, cinnamon, garlic, turmeric, plant sterols or red yeast rice for the same period."  "What we found was that rosuvastatin lowered LDL cholesterol by almost 38% and that was vastly superior to placebo and any of the six supplements studied in the trial," study Despina Hidden, M.D. of the [facility] [place] told NPR. He says this level of reduction is enough to lower the risk of heart attacks and strokes. The findings are published in the Journal of the [employer] of Cardiology.  "Oftentimes these supplements are marketed as 'natural ways' to lower your cholesterol," says Laffin. But he says none of the dietary supplements demonstrated any significant decrease in LDL cholesterol compared with a placebo. LDL cholesterol is considered the 'bad cholesterol' because it can contribute to plaque build-up in the artery walls - which can narrow the arteries, and set the stage for heart attacks and strokes"

## 2022-02-21 ENCOUNTER — Other Ambulatory Visit: Payer: Self-pay

## 2022-03-23 ENCOUNTER — Encounter (HOSPITAL_BASED_OUTPATIENT_CLINIC_OR_DEPARTMENT_OTHER): Payer: Self-pay

## 2022-04-11 ENCOUNTER — Encounter (HOSPITAL_BASED_OUTPATIENT_CLINIC_OR_DEPARTMENT_OTHER): Payer: Self-pay

## 2022-04-16 ENCOUNTER — Encounter (HOSPITAL_BASED_OUTPATIENT_CLINIC_OR_DEPARTMENT_OTHER): Payer: Self-pay | Admitting: Cardiology

## 2022-05-02 LAB — LIPID PANEL
Chol/HDL Ratio: 2.6 ratio (ref 0.0–5.0)
Cholesterol, Total: 164 mg/dL (ref 100–199)
HDL: 62 mg/dL (ref >39)
LDL Chol Calc (NIH): 89 mg/dL (ref 0–99)
Triglycerides: 69 mg/dL (ref 0–149)
VLDL Cholesterol Cal: 13 mg/dL (ref 5–40)

## 2022-05-02 LAB — HEPATIC FUNCTION PANEL
ALT: 29 IU/L (ref 0–44)
AST: 24 IU/L (ref 0–40)
Albumin: 4.9 g/dL (ref 3.9–4.9)
Alkaline Phosphatase: 57 IU/L (ref 44–121)
Bilirubin Total: 0.6 mg/dL (ref 0.0–1.2)
Bilirubin, Direct: 0.15 mg/dL (ref 0.00–0.40)
Total Protein: 6.9 g/dL (ref 6.0–8.5)

## 2022-05-02 LAB — LIPOPROTEIN A (LPA): Lipoprotein (a): 34.5 nmol/L (ref <75.0)

## 2022-05-03 ENCOUNTER — Telehealth (HOSPITAL_BASED_OUTPATIENT_CLINIC_OR_DEPARTMENT_OTHER): Payer: Self-pay

## 2022-05-03 DIAGNOSIS — I2510 Atherosclerotic heart disease of native coronary artery without angina pectoris: Secondary | ICD-10-CM

## 2022-05-03 MED ORDER — ROSUVASTATIN CALCIUM 20 MG PO TABS: 20.0000 mg | ORAL_TABLET | Freq: Every day | ORAL | 3 refills | Status: DC

## 2022-05-03 NOTE — Telephone Encounter (Addendum)
Seen by patient Joylene Draft on 05/03/2022  2:03 PM; follow up mychart message sent to patient to see if his is agreeable to med change and repeat labs!    ----- Message from Buford Dresser, MD sent at 05/03/2022  1:57 PM EST ----- Liver function is normal, and lp(a) is not elevated. Cholesterol levels are better (LDL went from 106 to 89) but we'd like them even better (<70). Is he doing well with the rosuvastatin? If so, I'd like to increase it from 10 to 20 mg daily and recheck cholesterol in about 3 months.

## 2022-05-03 NOTE — Addendum Note (Signed)
Addended by: Gerald Stabs on: 05/03/2022 04:11 PM   Modules accepted: Orders

## 2022-06-20 ENCOUNTER — Telehealth: Payer: Self-pay | Admitting: Cardiology

## 2022-06-20 DIAGNOSIS — I2510 Atherosclerotic heart disease of native coronary artery without angina pectoris: Secondary | ICD-10-CM

## 2022-06-20 MED ORDER — ROSUVASTATIN CALCIUM 20 MG PO TABS: 20.0000 mg | ORAL_TABLET | Freq: Every day | ORAL | 3 refills | Status: DC

## 2022-06-20 NOTE — Telephone Encounter (Signed)
Pt c/o medication issue:  1. Name of Medication: rosuvastatin (CRESTOR) 20 MG tablet   2. How are you currently taking this medication (dosage and times per day)?   Take 1 tablet (20 mg total) by mouth daily.    3. Are you having a reaction (difficulty breathing--STAT)? No  4. What is your medication issue? Pt stated he needs a new script [facility] due to them telling its too soon for his refill but he said he was advised to take 2 tablets daily instead of once so he went through them faster. Pt would like a callback once new script has been sent over. Please advise.

## 2022-06-20 NOTE — Telephone Encounter (Signed)
Spoke with patient and sent Rx for Crestor 20 mg daily

## 2022-07-25 ENCOUNTER — Ambulatory Visit (INDEPENDENT_AMBULATORY_CARE_PROVIDER_SITE_OTHER): Payer: Medicare Other | Admitting: Family Medicine

## 2022-07-25 ENCOUNTER — Encounter: Payer: Self-pay | Admitting: Family Medicine

## 2022-07-25 VITALS — BP 104/68 | HR 58 | Temp 97.7°F | Ht 68.0 in | Wt 145.2 lb

## 2022-07-25 DIAGNOSIS — Z8546 Personal history of malignant neoplasm of prostate: Secondary | ICD-10-CM

## 2022-07-25 DIAGNOSIS — Z1283 Encounter for screening for malignant neoplasm of skin: Secondary | ICD-10-CM

## 2022-07-25 DIAGNOSIS — Z125 Encounter for screening for malignant neoplasm of prostate: Secondary | ICD-10-CM

## 2022-07-25 DIAGNOSIS — R931 Abnormal findings on diagnostic imaging of heart and coronary circulation: Secondary | ICD-10-CM

## 2022-07-25 DIAGNOSIS — Z0000 Encounter for general adult medical examination without abnormal findings: Secondary | ICD-10-CM | POA: Diagnosis not present

## 2022-07-25 DIAGNOSIS — E785 Hyperlipidemia, unspecified: Secondary | ICD-10-CM

## 2022-07-25 NOTE — Progress Notes (Signed)
Phone: [phone]   Subjective:  Patient presents today for their Welcome to Medicare Exam    Preventive Screening-Counseling & Management  Vision screen: normal and sees eye doctor Vision Screening   Right eye Left eye Both eyes  Without correction     With correction 20/20 20/20 20/16    Advanced directives: HCPOA is his wife then nephew is back up. No living will- but is full code  Modifiable Risk Factors/behavioral risk assessment/psychosocial risk assessment Regular exercise: Continues to run regularly 3 to 5 miles at a time 6 days a week  Diet: Weight largely stable-reasonably healthy diet Wt Readings from Last 3 Encounters:  07/25/22 145 lb 3.2 oz (65.9 kg)  02/08/22 142 lb 12.8 oz (64.8 kg)  01/24/22 143 lb (64.9 kg)   Smoking Status: Never Smoker Second Hand Smoking status: No smokers in home Alcohol intake: 0 per week Other substance abuse/illicit drugs: none  Cardiac risk factors:  advanced age (older than 51 for men, 51 for women)  Treated hyperlipidemia  Known coronary artery calcium no Hypertension  No diabetes.  Family History: heart attack in father in 37's and brother in late 47s   Depression Screen/risk evaluation Risk factors: none. PHQ2 0     07/25/2022    2:21 PM 09/19/2021    8:05 AM 09/15/2020   11:26 AM 06/20/2019    4:00 PM 05/16/2018    3:54 PM  Depression screen PHQ 2/9  Decreased Interest 0 0 0 0 0  Down, Depressed, Hopeless 0 0 0 0 0  PHQ - 2 Score 0 0 0 0 0  Altered sleeping  0     Tired, decreased energy  0     Change in appetite  0     Feeling bad or failure about yourself   0     Trouble concentrating  0     Moving slowly or fidgety/restless  0     Suicidal thoughts  0     PHQ-9 Score  0     Difficult doing work/chores  Not difficult at all       Functional ability and level of safety Mobility assessment:  timed get up and go <12 seconds Activities of Daily Living- Independent in ADLs (toileting, bathing, dressing,  transferring, eating) and in IADLs (shopping, housekeeping, managing own medications, and handling finances) Home Safety: Loose rugs (none), smoke detectors (up to date ), small pets (none), grab bars (doesn't need yet), stairs (apartment looking for home- looking for one story), life-alert system (keeps cell phone on him) Hearing Difficulties: -patient declines Fall Risk: None     07/25/2022    2:07 PM 01/24/2022    1:58 PM 09/15/2020   11:26 AM 08/02/2018   12:40 PM  Fall Risk   Falls in the past year? 0 0 0 0  Number falls in past yr: 0 0 0   Injury with Fall? 0 0 0   Risk for fall due to : No Fall Risks No Fall Risks No Fall Risks   Follow up Falls evaluation completed Falls prevention discussed Falls evaluation completed Falls evaluation completed   Opioid use history: no long term opioids use Self assessment of health status: "excellent other than concern about calcium scoring"  Required Immunizations needed today:  Recommended fall flu and COVID shot.  Discussed Shingrix at pharmacy  Immunization History  Administered Date(s) Administered   Fluad Quad(high Dose 65+) 11/27/2021   Influenza,inj,Quad PF,6+ Mos 12/10/2014   Influenza-Unspecified 12/11/2012, 12/29/2013, 12/13/2015, 12/28/2016, 12/17/2017,  12/12/2018   Moderna SARS-COV2 Booster Vaccination 12/12/2021   Moderna Sars-Covid-2 Vaccination 05/12/2019, 06/10/2019, 02/03/2020, 09/03/2020   PNEUMOCOCCAL CONJUGATE-20 01/24/2022   PPD Test 05/06/2014   Respiratory Syncytial Virus Vaccine,Recomb Aduvanted(Arexvy) 11/27/2021   Td 07/08/2008   Tdap 08/15/2018   Typhoid Inactivated 08/22/2016   Health Maintenance  Topic Date Due   COVID-19 Vaccine (5 - 2023-24 season) 08/10/2022*   Zoster (Shingles) Vaccine (1 of 2) 10/25/2022*   Hepatitis C Screening: USPSTF Recommendation to screen - Ages 18-79 yo.  03/09/2098*   HIV Screening  03/09/2098*   Flu Shot  10/12/2022   Medicare Annual Wellness Visit  07/25/2023   Colon Cancer  Screening  10/03/2027   DTaP/Tdap/Td vaccine (3 - Td or Tdap) 08/14/2028   Pneumonia Vaccine  Completed   HPV Vaccine  Aged Out  *Topic was postponed. The date shown is not the original due date.   Screening tests-has opted out of hepatitis C screening ahd HIV screen Colon cancer screening- 10/02/2017 with 10 year repeat planned Lung Cancer screening- never smoker so does not qualify for Skin cancer screening- has seen dermatology in the past but not recently - wants referral back Prostate cancer screening-history of prostate cancer-we continue to trend PSA with labs- does not see urology at this time Lab Results  Component Value Date   PSA 0.01 (L) 11/28/2021   PSA 0.02 (L) 09/15/2020   PSA <0.1 06/20/2019   The following were reviewed and entered/updated in epic: Past Medical History:  Diagnosis Date   Hyperlipidemia 04/23/2014   Paget disease of bone 02/26/2019   Tonsillar cyst    Eval by Dr. Ezzard Standing of ENT   Patient Active Problem List   Diagnosis Date Noted   Agatston coronary artery calcium score greater than 400 01/24/2022    Priority: High   Hyperlipidemia 04/23/2014    Priority: Medium    Paget disease of bone 02/26/2019    Priority: Low   History of prostate cancer 03/02/2016    Priority: Low   Past Surgical History:  Procedure Laterality Date   EYE SURGERY     laser surgery for retina   VASECTOMY      Family History  Problem Relation Age of Onset   Cancer Mother        breast CA   Diabetes Mother    Cancer Father 70       liver- alcohol related   Heart attack Father 57   Diabetes Father    Diabetes Brother    CAD Brother        too long for stents    Medications- reviewed and updated Current Outpatient Medications  Medication Sig Dispense Refill   aspirin EC 81 MG tablet Take 1 tablet (81 mg total) by mouth daily. Swallow whole. 90 tablet 3   Multiple Vitamin (MULTI-VITAMINS) TABS Take 1 tablet by mouth daily.     rosuvastatin (CRESTOR) 20 MG  tablet Take 1 tablet (20 mg total) by mouth daily. 90 tablet 3   No current facility-administered medications for this visit.   Allergies-reviewed and updated No Known Allergies  Social History   Socioeconomic History   Marital status: Married    Spouse name: Aram Beecham   Number of children: 2   Years of education: Not on file   Highest education level: Master's degree (e.g., MA, MS, MEng, MEd, MSW, MBA)  Occupational History    Employer: [employer]  Tobacco Use   Smoking status: Never   Smokeless tobacco: Never  Substance and Sexual Activity   Alcohol use: No    Alcohol/week: 0.0 standard drinks of alcohol   Drug use: No   Sexual activity: Not on file  Other Topics Concern   Not on file  Social History Narrative   Married. 2 sons - younger son in [place], oldest in August. no grandkids yet. No pets.       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 Teaching at [employer] in spring 2024 but may teach at [employer]	[place] [employer]   -has taught across the [place] as well!       Hobbies: running and reading       Patient is right-handed. He lives in a 2 level home with his wife.   Social Determinants of Health   Financial Resource Strain: none  Food Insecurity: no issues  Transportation Needs: no issues  Physical Activity: excellent  Stress: better with part time teaching  Social Connections: good relationships and support with friends/family/church    Objective  Objective:  BP 104/68   Pulse (!) 58   Temp 97.7 F (36.5 C)   Ht 5\' 8"  (1.727 m)   Wt 145 lb 3.2 oz (65.9 kg)   SpO2 97%   BMI 22.08 kg/m  Gen: NAD, resting comfortably HEENT: Mucous membranes are moist. Oropharynx normal other than small left tonsillar cyst about 1.5 cm Neck: no thyromegaly CV: RRR no murmurs rubs or gallops Lungs: CTAB no crackles, wheeze, rhonchi Abdomen: soft/nontender/nondistended/normal bowel sounds. No rebound or guarding.  Ext: no edema Skin: warm,  dry Neuro: grossly normal, moves all extremities, PERRLA    Assessment and Plan:   Welcome to Medicare exam completed-  Educated, counseled and referred based on above elements Educated, counseled and referred as appropriate for preventative needs Discussed and documented a written plan for preventiative services and screenings with personalized health advice- After Visit Summary was given to patient which included this plan  4. EKG offered [id number]- patient opts out- had with cardiology within 6 months  Status of chronic or acute concerns   #CT calcium scoring-514 on 01/17/2022 which is 83rd percentile and family history of MI in 58s and father as well as brother Jesusita Oka at age 75 #hyperlipidemia S: Medication:Rosuvastatin 20 mg daily, aspirin 81 mg -Lipoprotein a not elevated -no chest pain or shortness of breath  Lab Results  Component Value Date   CHOL 164 05/01/2022   HDL 62 05/01/2022   LDLCALC 89 05/01/2022   LDLDIRECT 138.2 09/24/2009   TRIG 69 05/01/2022   CHOLHDL 2.6 05/01/2022  A/P: coronary artery disease asymptomatic - continue current medications  Lipids suspect improved- has follow up planned with card and wants to hold off on repeat lipid and Hepatic function panels  #balance issues- saw neurology 2020- see note- mild degradation since that time of balance- reports stable since last visit if being careful   #history of prostate cancer- 10/22/15 robotic prostatectomy [facility] doctor. We monitor PSA yearly   Recommended follow up: 1 year follow up- can cael July vsiit with me Future Appointments  Date Time Provider Department Center  09/21/2022  8:00 AM Shelva Majestic, MD [facility]     Lab/Order associations:   ICD-10-CM   1. Preventative health care  Z00.00     2. Hyperlipidemia, unspecified hyperlipidemia type  E78.5 CBC with Differential/Platelet    Basic Metabolic Panel (BMET)    3. Agatston coronary artery calcium score greater than 400  R93.1  4. History of prostate cancer  Z85.46 PSA, Medicare    5. Screening exam for skin cancer  Z12.83 Ambulatory referral to Dermatology    6. Screening for prostate cancer  Z12.5 PSA, Medicare      No orders of the defined types were placed in this encounter.   Return precautions advised. Tana Conch, MD

## 2022-07-25 NOTE — Patient Instructions (Signed)
Let us know when you get your [facility] vaccine or any other COVID vaccines at the pharmacy.  Recommended follow up: Return in about 1 year (around 07/25/2023) for followup or sooner if needed.Schedule b4 you leave.  Cancel July visit on your way out  Ronnie Hawkins , Thank you for taking time to come for your Medicare Wellness Visit. I appreciate your ongoing commitment to your health goals. Please review the following plan we discussed and let me know if I can assist you in the future.   These are the goals we discussed: Keep up great job with regular exercise We have placed a referral for you today to dermatology to update full skin check. In some cases you will see # listed below- you can call this if you have not heard within a week. If you do not see # listed- you should receive a mychart message or phone call within a week with the # to call.   Schedule a lab visit at the check out desk within 2 weeks. Return for future fasting labs meaning nothing but water after midnight please. Ok to take your medications with water.     This is a list of the screening recommended for you and due dates:  Health Maintenance  Topic Date Due   COVID-19 Vaccine (5 - 2023-24 season) 08/10/2022* consider in fall   Zoster (Shingles) Vaccine (1 of 2) 10/25/2022* consider at pharmacy   Hepatitis C Screening: USPSTF Recommendation to screen - Ages 18-79 yo.  03/09/2098* opted out   HIV Screening  03/09/2098*opted out   Flu Shot  10/12/2022   Medicare Annual Wellness Visit  07/25/2023   Colon Cancer Screening  10/03/2027   DTaP/Tdap/Td vaccine (3 - Td or Tdap) 08/14/2028   Pneumonia Vaccine  Completed   HPV Vaccine  Aged Out  *Topic was postponed. The date shown is not the original due date.

## 2022-07-26 ENCOUNTER — Other Ambulatory Visit (INDEPENDENT_AMBULATORY_CARE_PROVIDER_SITE_OTHER): Payer: Medicare Other

## 2022-07-26 DIAGNOSIS — E785 Hyperlipidemia, unspecified: Secondary | ICD-10-CM | POA: Diagnosis not present

## 2022-07-26 DIAGNOSIS — Z8546 Personal history of malignant neoplasm of prostate: Secondary | ICD-10-CM | POA: Diagnosis not present

## 2022-07-26 DIAGNOSIS — Z125 Encounter for screening for malignant neoplasm of prostate: Secondary | ICD-10-CM | POA: Diagnosis not present

## 2022-07-26 LAB — CBC WITH DIFFERENTIAL/PLATELET
Basophils Absolute: 0 10*3/uL (ref 0.0–0.1)
Basophils Relative: 0.9 % (ref 0.0–3.0)
Eosinophils Absolute: 0.2 10*3/uL (ref 0.0–0.7)
Eosinophils Relative: 5 % (ref 0.0–5.0)
HCT: 45.7 % (ref 39.0–52.0)
Hemoglobin: 15.7 g/dL (ref 13.0–17.0)
Lymphocytes Relative: 28 % (ref 12.0–46.0)
Lymphs Abs: 1.3 10*3/uL (ref 0.7–4.0)
MCHC: 34.3 g/dL (ref 30.0–36.0)
MCV: 97.5 fl (ref 78.0–100.0)
Monocytes Absolute: 0.5 10*3/uL (ref 0.1–1.0)
Monocytes Relative: 10.3 % (ref 3.0–12.0)
Neutro Abs: 2.7 10*3/uL (ref 1.4–7.7)
Neutrophils Relative %: 55.8 % (ref 43.0–77.0)
Platelets: 253 10*3/uL (ref 150.0–400.0)
RBC: 4.69 Mil/uL (ref 4.22–5.81)
RDW: 13 % (ref 11.5–15.5)
WBC: 4.8 10*3/uL (ref 4.0–10.5)

## 2022-07-26 LAB — BASIC METABOLIC PANEL
BUN: 18 mg/dL (ref 6–23)
CO2: 32 mEq/L (ref 19–32)
Calcium: 9.4 mg/dL (ref 8.4–10.5)
Chloride: 103 mEq/L (ref 96–112)
Creatinine, Ser: 0.97 mg/dL (ref 0.40–1.50)
GFR: 81.82 mL/min (ref >60.00)
Glucose, Bld: 94 mg/dL (ref 70–99)
Potassium: 4.5 mEq/L (ref 3.5–5.1)
Sodium: 142 mEq/L (ref 135–145)

## 2022-07-26 LAB — PSA, MEDICARE: PSA: 0.01 ng/ml — ABNORMAL LOW (ref 0.10–4.00)

## 2022-08-06 ENCOUNTER — Encounter: Payer: Self-pay | Admitting: Family Medicine

## 2022-08-10 LAB — LIPID PANEL
Chol/HDL Ratio: 2.4 ratio (ref 0.0–5.0)
Cholesterol, Total: 144 mg/dL (ref 100–199)
HDL: 59 mg/dL (ref >39)
LDL Chol Calc (NIH): 70 mg/dL (ref 0–99)
Triglycerides: 80 mg/dL (ref 0–149)
VLDL Cholesterol Cal: 15 mg/dL (ref 5–40)

## 2022-08-10 LAB — HEPATIC FUNCTION PANEL
ALT: 24 IU/L (ref 0–44)
AST: 22 IU/L (ref 0–40)
Albumin: 4.6 g/dL (ref 3.9–4.9)
Alkaline Phosphatase: 57 IU/L (ref 44–121)
Bilirubin Total: 0.6 mg/dL (ref 0.0–1.2)
Bilirubin, Direct: 0.19 mg/dL (ref 0.00–0.40)
Total Protein: 6.5 g/dL (ref 6.0–8.5)

## 2022-08-10 NOTE — Progress Notes (Signed)
Ronnie Payor, PhD, LAT, ATC acting as a scribe for Clementeen Graham, MD.  Ronnie Hawkins is a 67 y.o. male who presents to [facility] today for L ankle pain. Pt was last seen by Dr. Denyse Amass in 2020 for Paget disease and was advised to proceed with zoledronic acid infusions.   Today, pt reports lately he has noticed that his L ankle tends to "give way" esp when going down stairs. If feels like he is lacking in strength. He also c/o a sharp pain along the lateral aspect of his L hip.  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  Dx imaging: 02/25/19 L ankle MRI  02/11/19 L ankle XR  Pertinent review of systems: No fevers or chills  Relevant historical information: Paget's disease left ankle treated with Reclast 2021 Coronary arteries calcifications high calcium score   Exam:  BP (!) 170/92   Pulse (!) 55   Ht 5\' 8"  (1.727 m)   Wt 150 lb (68 kg)   SpO2 98%   BMI 22.81 kg/m  General: Well Developed, well nourished, and in no acute distress.   MSK: Left ankle normal. Normal motion. Mildly tender palpation medial anterior ankle. Stable ligamentous exam. Intact strength.    Lab and Radiology Results  Diagnostic Limited MSK Ultrasound of: Left ankle Mild joint effusion visible at medial ankle. Minimal tenosynovitis posterior tibialis tendon. Calcifications apparent at posterior tibialis artery at tarsal tunnel.  Good flow on Doppler visible. Impression: Joint effusion medial ankle.  Concern for PAD possibility.  X-ray images left ankle obtained today personally and independently interpreted Mild DJD no collapse of talus on xray.  Await formal radiology review  EXAM: MRI OF THE LEFT ANKLE WITHOUT CONTRAST   IMPRESSION: 1. Findings consistent with Paget's disease of the talus. No osseous fracture or avascular necrosis. 2. 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 3. Mild tenosynovitis involving the posterior compartment and peroneal tendons. 4. Mild tibiotalar joint osteoarthritis.     Electronically Signed   By: Jonna Clark M.D.   On: 02/25/2019 10:19 .   Assessment and Plan: 67 y.o. male with left ankle pain.  He has a history of Paget's disease that has been pretty successfully treated with Reclast infusion in 2021.  He was somewhat lost to follow-up and did well until just recently when he is having some ankle discomfort and instability.  Will go ahead and check x-ray and alkaline phosphatase for return of clinically active Paget's disease.  Will go ahead and also refer to PT for ankle stability training.  If concern for Paget's disease will likely offer a second Reclast infusion.  As for his artery calcification.  He has a equivalent issue with his coronary arteries and is currently being treated effectively by his PCP with atorvastatin and aspirin.  He does not have symptoms to suggest claudication or PAD.  I do not think ABI is necessary at this time.   PDMP not reviewed this encounter. Orders Placed This Encounter  Procedures   DG Ankle Complete Left    Standing Status:   Future    Number of Occurrences:   1    Standing Expiration Date:   08/11/2023    Order Specific Question:   Reason for Exam (SYMPTOM  OR DIAGNOSIS REQUIRED)    Answer:   left ankle pain    Order Specific Question:   Preferred imaging location?  Answer:   [facility]   [place] LIMITED JOINT SPACE STRUCTURES LOW LEFT(NO LINKED CHARGES)    Order Specific Question:   Reason for Exam (SYMPTOM  OR DIAGNOSIS REQUIRED)    Answer:   ankle pain    Order Specific Question:   Preferred imaging location?    Answer:   [facility]   Alkaline phosphatase    Standing Status:   Future    Number of Occurrences:   1    Standing Expiration Date:   08/11/2023   Ambulatory referral to Physical Therapy    Referral Priority:    Routine    Referral Type:   Physical Medicine    Referral Reason:   Specialty Services Required    Requested Specialty:   Physical Therapy    Number of Visits Requested:   1   No orders of the defined types were placed in this encounter.    Discussed warning signs or symptoms. Please see discharge instructions. Patient expresses understanding.   The above documentation has been reviewed and is accurate and complete Clementeen Graham, M.D.

## 2022-08-11 ENCOUNTER — Other Ambulatory Visit: Payer: Self-pay

## 2022-08-11 ENCOUNTER — Ambulatory Visit (INDEPENDENT_AMBULATORY_CARE_PROVIDER_SITE_OTHER): Payer: Medicare Other | Admitting: Family Medicine

## 2022-08-11 ENCOUNTER — Ambulatory Visit (INDEPENDENT_AMBULATORY_CARE_PROVIDER_SITE_OTHER): Payer: Medicare Other

## 2022-08-11 VITALS — BP 170/92 | HR 55 | Ht 68.0 in | Wt 150.0 lb

## 2022-08-11 DIAGNOSIS — M889 Osteitis deformans of unspecified bone: Secondary | ICD-10-CM

## 2022-08-11 DIAGNOSIS — M25572 Pain in left ankle and joints of left foot: Secondary | ICD-10-CM | POA: Diagnosis not present

## 2022-08-11 DIAGNOSIS — G8929 Other chronic pain: Secondary | ICD-10-CM

## 2022-08-11 LAB — ALKALINE PHOSPHATASE: Alkaline Phosphatase: 43 U/L (ref 39–117)

## 2022-08-11 NOTE — Patient Instructions (Addendum)
Thank you for coming in today.   Please get an Xray today before you leave   Please get labs today before you leave   I've referred you to Physical Therapy.  Let us know if you don't hear from them in one week.   If there is enough indication on labs or xray we can re-do reclast.

## 2022-08-11 NOTE — Progress Notes (Signed)
Alkaline phosphatase the labs for bone turnover look okay.  They have been okay over the last several years.  Will see what the radiologist has to say about your ankle but I think working to be okay.

## 2022-08-12 ENCOUNTER — Encounter: Payer: Self-pay | Admitting: Family Medicine

## 2022-08-14 ENCOUNTER — Ambulatory Visit (INDEPENDENT_AMBULATORY_CARE_PROVIDER_SITE_OTHER): Payer: Medicare Other | Admitting: Physical Therapy

## 2022-08-14 ENCOUNTER — Encounter: Payer: Self-pay | Admitting: Physical Therapy

## 2022-08-14 DIAGNOSIS — M6281 Muscle weakness (generalized): Secondary | ICD-10-CM | POA: Diagnosis not present

## 2022-08-14 DIAGNOSIS — M25572 Pain in left ankle and joints of left foot: Secondary | ICD-10-CM | POA: Diagnosis not present

## 2022-08-14 NOTE — Telephone Encounter (Signed)
Please schedule pt for  nurse visit for TB skin test. 

## 2022-08-14 NOTE — Therapy (Signed)
OUTPATIENT PHYSICAL THERAPY LOWER EXTREMITY EVALUATION   Patient Name: Ronnie Hawkins MRN: [id number] DOB:1955-11-08, 67 y.o., male Today's Date: 08/14/2022  END OF SESSION:  PT End of Session - 08/14/22 1630     Visit Number 1    Number of Visits 16    Date for PT Re-Evaluation 10/09/22    Authorization Type Medicare    PT Start Time 1220    PT Stop Time 1300    PT Time Calculation (min) 40 min    Activity Tolerance Patient tolerated treatment well    Behavior During Therapy [facility] for tasks assessed/performed             Past Medical History:  Diagnosis Date   Hyperlipidemia 04/23/2014   Paget disease of bone 02/26/2019   Tonsillar cyst    Eval by Dr. Ezzard Standing of ENT   Past Surgical History:  Procedure Laterality Date   EYE SURGERY     laser surgery for retina   VASECTOMY     Patient Active Problem List   Diagnosis Date Noted   Agatston coronary artery calcium score greater than 400 01/24/2022   Paget disease of bone 02/26/2019   History of prostate cancer 03/02/2016   Hyperlipidemia 04/23/2014    PCP: Tana Conch  REFERRING PROVIDER: Clementeen Graham   REFERRING DIAG: L foot/ankle pain   THERAPY DIAG:  Pain in left ankle and joints of left foot  Muscle weakness (generalized)  Rationale for Evaluation and Treatment: Rehabilitation  ONSET DATE:   SUBJECTIVE:   SUBJECTIVE STATEMENT:  Pt states L ankle pain, states he rolled it 10 years ago, when he was trail running.   About 1 mo ago ankle became more sore. He was Doing more stairs at the time-states weakness sensation when going down steps, like ankle is going to give way.  He still runs Trails, about 4 mi, 6 days/wk. 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  Most pain at medial malleolus, some lateral pain and some on achilles.    PERTINENT HISTORY: Pagets disease,   PAIN:  Are you having pain? Yes: NPRS scale: 3/10 Pain location: L  ankle.   Pain description: weak, sore Aggravating factors: going down steps, increased activity, walk, run.  Relieving factors: none stated.   PRECAUTIONS: None  WEIGHT BEARING RESTRICTIONS: No  FALLS:  Has patient fallen in last 6 months? Yes. Number of falls a few "trips" on roots, when trail running    PLOF: Independent  PATIENT GOALS: Strength, confident with strength and doing down steps, not to give way. Would like to run longer [place].   NEXT MD VISIT:   OBJECTIVE:   DIAGNOSTIC FINDINGS:   PATIENT SURVEYS:  FOTO- 74.9  COGNITION: Overall cognitive status: Within functional limits for tasks assessed     SENSATION: WFL  EDEMA: none   POSTURE: No Significant postural limitations Feet:  neutral foot posture bilaterally   PALPATION: Minimal pain to palpate medial, lateral, or achilles today.   LOWER EXTREMITY ROM:  Hip, knee ROM: WFL Ankle: 7 deg DF on L.    LOWER EXTREMITY MMT:  Hips: 4+/5,  Knee: 5/5 Ankle: 4/5   LOWER EXTREMITY SPECIAL TESTS:    FUNCTIONAL TESTS:  Step downs- pain on L, with weakness feeling with deeper DF.   GAIT:  unremarkable    TODAY'S TREATMENT:  DATE:    Ther ex:  standing Heel raises 2 x 10  (given for HEP) Trial for Gastroc stretch at wall, and glides on step: painful .  PATIENT EDUCATION:  Education details: PT POC, Exam findings, HEP Person educated: Patient Education method: Explanation, Demonstration, Tactile cues, Verbal cues, and Handouts Education comprehension: verbalized understanding, returned demonstration, verbal cues required, tactile cues required, and needs further education   HOME EXERCISE PROGRAM:   ASSESSMENT:  CLINICAL IMPRESSION: Patient presents with primary complaint of increased pain in L ankle. He has minimal pain to palpate or test today, but has increased discomfort  with increased activity. He has mild stiffness for DF, and mild weakness and instability in ankle. He will benefit from education on this as well as HEP and recommendations for running. He has pain and weakness with descending stairs, and will benefit from work on this as well. Pt to benefit from skilled PT to improve deficits and pain.   OBJECTIVE IMPAIRMENTS: decreased activity tolerance, decreased ROM, decreased strength, improper body mechanics, and pain.   ACTIVITY LIMITATIONS: standing, squatting, stairs, and locomotion level  PARTICIPATION LIMITATIONS: cleaning, shopping, community activity, and yard work  PERSONAL FACTORS: 1 comorbidity: OA and padgets  are also affecting patient's functional outcome.   REHAB POTENTIAL: Good  CLINICAL DECISION MAKING: Stable/uncomplicated  EVALUATION COMPLEXITY: Low   GOALS: Goals reviewed with patient?yes  SHORT TERM GOALS: Target date: 08/28/2022    Pt to be independent with initial HEP Goal status: INITIAL     LONG TERM GOALS: Target date: 10/09/2022   Pt to be independent with final HEP  Goal status: INITIAL  2.  Pt to report decreased pain in L ankle to 0-2/10 with activity   Goal status: INITIAL  3.  Pt to demo strength and stability of L ankle to be [facility] with dynamic activity and single leg activity,   Goal status: INITIAL  4.  Pt to demo ability for descending stairs without pain or deficit, to improve ability for community activity.   Goal status: INITIAL    PLAN:  PT FREQUENCY: 1-2x/week  PT DURATION: 8 weeks  PLANNED INTERVENTIONS: Therapeutic exercises, Therapeutic activity, Neuromuscular re-education, Patient/Family education, Self Care, Joint mobilization, Joint manipulation, Stair training, Orthotic/Fit training, DME instructions, Aquatic Therapy, Dry Needling, Electrical stimulation, Cryotherapy, Moist heat, Taping, Ultrasound, Ionotophoresis 4mg /ml Dexamethasone, Manual therapy,  Vasopneumatic device,  Traction, Spinal manipulation, Spinal mobilization,Balance training, Gait training,   PLAN FOR NEXT SESSION:    Sedalia Muta, PT, DPT 4:39 PM  08/14/22

## 2022-08-14 NOTE — Telephone Encounter (Signed)
Unable to reach pt, lvm

## 2022-08-14 NOTE — Telephone Encounter (Signed)
Patient dropped off document health examination , to be filled out by provider. Patient requested to send it via Call Patient to pick up within 5-days. Document is located in providers tray at front office.Please advise at Mobile [phone] (mobile)

## 2022-08-15 NOTE — Telephone Encounter (Signed)
Patient is scheduled for TB Test on 08/23/22

## 2022-08-15 NOTE — Progress Notes (Signed)
Left ankle x-ray support Paget's disease and some arthritis.  We could try another Reclast infusion but there is not a ton of evidence to support that.  I could try a cortisone shot and ankle 2.  I would recommend trying Reclast first before cortisone injection.

## 2022-08-17 ENCOUNTER — Ambulatory Visit: Payer: Medicare Other | Admitting: Physical Therapy

## 2022-08-19 ENCOUNTER — Encounter (HOSPITAL_BASED_OUTPATIENT_CLINIC_OR_DEPARTMENT_OTHER): Payer: Self-pay

## 2022-08-23 ENCOUNTER — Ambulatory Visit (INDEPENDENT_AMBULATORY_CARE_PROVIDER_SITE_OTHER): Payer: Medicare Other | Admitting: *Deleted

## 2022-08-23 ENCOUNTER — Encounter: Payer: Self-pay | Admitting: Physical Therapy

## 2022-08-23 ENCOUNTER — Ambulatory Visit (INDEPENDENT_AMBULATORY_CARE_PROVIDER_SITE_OTHER): Payer: Medicare Other | Admitting: Physical Therapy

## 2022-08-23 DIAGNOSIS — M6281 Muscle weakness (generalized): Secondary | ICD-10-CM | POA: Diagnosis not present

## 2022-08-23 DIAGNOSIS — M25572 Pain in left ankle and joints of left foot: Secondary | ICD-10-CM | POA: Diagnosis not present

## 2022-08-23 DIAGNOSIS — Z111 Encounter for screening for respiratory tuberculosis: Secondary | ICD-10-CM

## 2022-08-23 NOTE — Progress Notes (Signed)
Patient present for TB test  TB test given on left fore arm  Advise to return in two days after 10:00 for TB test to be read  Patient verbalized understanding  Stated left work form at front office a couple of days ago   No form collected today by me  Ronnie Hawkins

## 2022-08-23 NOTE — Therapy (Signed)
OUTPATIENT PHYSICAL THERAPY LOWER EXTREMITY TREATMENT   Patient Name: Ronnie Hawkins MRN: [id number] DOB:10/31/1955, 67 y.o., male Today's Date: 08/23/2022  END OF SESSION:  PT End of Session - 08/23/22 0951     Visit Number 2    Number of Visits 16    Date for PT Re-Evaluation 10/09/22    Authorization Type Medicare    PT Start Time 0850    PT Stop Time 0930    PT Time Calculation (min) 40 min    Activity Tolerance Patient tolerated treatment well    Behavior During Therapy [facility] for tasks assessed/performed              Past Medical History:  Diagnosis Date   Hyperlipidemia 04/23/2014   Paget disease of bone 02/26/2019   Tonsillar cyst    Eval by Dr. Ezzard Standing of ENT   Past Surgical History:  Procedure Laterality Date   EYE SURGERY     laser surgery for retina   VASECTOMY     Patient Active Problem List   Diagnosis Date Noted   Agatston coronary artery calcium score greater than 400 01/24/2022   Paget disease of bone 02/26/2019   History of prostate cancer 03/02/2016   Hyperlipidemia 04/23/2014    PCP: Tana Conch  REFERRING PROVIDER: Clementeen Graham   REFERRING DIAG: L foot/ankle pain   THERAPY DIAG:  Pain in left ankle and joints of left foot  Muscle weakness (generalized)  Rationale for Evaluation and Treatment: Rehabilitation  ONSET DATE:   SUBJECTIVE:   SUBJECTIVE STATEMENT: Has done a little longer run 4-6 mi. No worse pain,  No hip pain. Was having some lateral hip pain.   Pt states L ankle pain, states he rolled it 10 years ago, when he was trail running.   About 1 mo ago ankle became more sore. He was Doing more stairs at the time-states weakness sensation when going down steps, like ankle is going to give way.  He still runs Trails, about 4 mi, 6 days/wk. 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  Most pain at medial malleolus, some lateral pain and some on achilles.    PERTINENT  HISTORY: Pagets disease,   PAIN:  Are you having pain? Yes: NPRS scale: 3/10 Pain location: L  ankle.  Pain description: weak, sore Aggravating factors: going down steps, increased activity, walk, run.  Relieving factors: none stated.   PRECAUTIONS: None  WEIGHT BEARING RESTRICTIONS: No  FALLS:  Has patient fallen in last 6 months? Yes. Number of falls a few "trips" on roots, when trail running    PLOF: Independent  PATIENT GOALS: Strength, confident with strength and doing down steps, not to give way. Would like to run longer [place].   NEXT MD VISIT:   OBJECTIVE:   DIAGNOSTIC FINDINGS:   PATIENT SURVEYS:  FOTO- 74.9  COGNITION: Overall cognitive status: Within functional limits for tasks assessed     SENSATION: WFL  EDEMA: none   POSTURE: No Significant postural limitations Feet:  neutral foot posture bilaterally   PALPATION: Minimal pain to palpate medial, lateral, or achilles today.   LOWER EXTREMITY ROM:  Hip, knee ROM: WFL Ankle: 7 deg DF on L.    LOWER EXTREMITY MMT:  Hips: 4+/5,  Knee: 5/5 Ankle: 4/5   LOWER EXTREMITY SPECIAL TESTS:    FUNCTIONAL TESTS:  Step downs- pain on L, with weakness feeling with deeper DF.   GAIT:  unremarkable  TODAY'S TREATMENT:                                                                                                                              DATE:    08/23/2022 Therapeutic Exercise: Aerobic: Supine:  Ankle INV  RTB x 20;  Seated:  Standing: Heel raises x 20;  SL x 10 bil;   Squats ( mild pain at medial ankle) x 12;  Stretches:  gastroc stretch on step 15 sec x 4;  Gastroc stretch at wall x 3 bil with education for HEP  Neuromuscular Re-education: SLS 15 sec x 2 bil;  SLS with UE rotation x 10 bil;  SLS with head turns x 10 bil; with education for HEP Manual Therapy: Bil ankle mobs to inc DF, Distraction on L;  Therapeutic Activity: Self Care:   PATIENT EDUCATION:  Education details:  updated and reviewed HEP Person educated: Patient Education method: Explanation, Demonstration, Tactile cues, Verbal cues, and Handouts Education comprehension: verbalized understanding, returned demonstration, verbal cues required, tactile cues required, and needs further education   HOME EXERCISE PROGRAM:   ASSESSMENT:  CLINICAL IMPRESSION: Pt with good tolerance for exercises today. He has improved ability for DF stretch with only slight pain at medial ankle (was quite painful previously). He has noted increased pain at medial ankle with mod-deeper squat, but no pain with INV strength today. Challenged with SLS with rotation today. Will continue to benefit from mobilization of ankle to improve pain with weight bearing DF position.   Eval: Patient presents with primary complaint of increased pain in L ankle. He has minimal pain to palpate or test today, but has increased discomfort with increased activity. He has mild stiffness for DF, and mild weakness and instability in ankle. He will benefit from education on this as well as HEP and recommendations for running. He has pain and weakness with descending stairs, and will benefit from work on this as well. Pt to benefit from skilled PT to improve deficits and pain.   OBJECTIVE IMPAIRMENTS: decreased activity tolerance, decreased ROM, decreased strength, improper body mechanics, and pain.   ACTIVITY LIMITATIONS: standing, squatting, stairs, and locomotion level  PARTICIPATION LIMITATIONS: cleaning, shopping, community activity, and yard work  PERSONAL FACTORS: 1 comorbidity: OA and padgets  are also affecting patient's functional outcome.   REHAB POTENTIAL: Good  CLINICAL DECISION MAKING: Stable/uncomplicated  EVALUATION COMPLEXITY: Low   GOALS: Goals reviewed with patient?yes  SHORT TERM GOALS: Target date: 08/28/2022    Pt to be independent with initial HEP Goal status: INITIAL     LONG TERM GOALS: Target date:  10/09/2022   Pt to be independent with final HEP  Goal status: INITIAL  2.  Pt to report decreased pain in L ankle to 0-2/10 with activity   Goal status: INITIAL  3.  Pt to demo strength and stability of L ankle to be [facility] with dynamic activity and single leg activity,   Goal status:  INITIAL  4.  Pt to demo ability for descending stairs without pain or deficit, to improve ability for community activity.   Goal status: INITIAL    PLAN:  PT FREQUENCY: 1-2x/week  PT DURATION: 8 weeks  PLANNED INTERVENTIONS: Therapeutic exercises, Therapeutic activity, Neuromuscular re-education, Patient/Family education, Self Care, Joint mobilization, Joint manipulation, Stair training, Orthotic/Fit training, DME instructions, Aquatic Therapy, Dry Needling, Electrical stimulation, Cryotherapy, Moist heat, Taping, Ultrasound, Ionotophoresis 4mg /ml Dexamethasone, Manual therapy,  Vasopneumatic device, Traction, Spinal manipulation, Spinal mobilization,Balance training, Gait training,   PLAN FOR NEXT SESSION:    Sedalia Muta, PT, DPT 9:55 AM  08/23/22

## 2022-08-25 ENCOUNTER — Ambulatory Visit: Payer: Medicare Other

## 2022-08-30 ENCOUNTER — Encounter: Payer: Medicare Other | Admitting: Physical Therapy

## 2022-09-06 ENCOUNTER — Encounter: Payer: Self-pay | Admitting: Physical Therapy

## 2022-09-06 ENCOUNTER — Ambulatory Visit (INDEPENDENT_AMBULATORY_CARE_PROVIDER_SITE_OTHER): Payer: Medicare Other | Admitting: Physical Therapy

## 2022-09-06 DIAGNOSIS — M25572 Pain in left ankle and joints of left foot: Secondary | ICD-10-CM | POA: Diagnosis not present

## 2022-09-06 DIAGNOSIS — M6281 Muscle weakness (generalized): Secondary | ICD-10-CM | POA: Diagnosis not present

## 2022-09-06 NOTE — Therapy (Signed)
OUTPATIENT PHYSICAL THERAPY LOWER EXTREMITY TREATMENT   Patient Name: Ronnie Hawkins MRN: [id number] DOB:[birth year], 67 y.o., male Today's Date: 09/06/2022  END OF SESSION:  PT End of Session - 09/06/22 0847     Visit Number 3    Number of Visits 16    Date for PT Re-Evaluation 10/09/22    Authorization Type Medicare    PT Start Time 0847    PT Stop Time 0930    PT Time Calculation (min) 43 min    Activity Tolerance Patient tolerated treatment well    Behavior During Therapy [facility] for tasks assessed/performed              Past Medical History:  Diagnosis Date   Hyperlipidemia 04/23/2014   Paget disease of bone 02/26/2019   Tonsillar cyst    Eval by Dr. Ezzard Standing of ENT   Past Surgical History:  Procedure Laterality Date   EYE SURGERY     laser surgery for retina   VASECTOMY     Patient Active Problem List   Diagnosis Date Noted   Agatston coronary artery calcium score greater than 400 01/24/2022   Paget disease of bone 02/26/2019   History of prostate cancer 03/02/2016   Hyperlipidemia 04/23/2014    PCP: Tana Conch  REFERRING PROVIDER: Clementeen Graham   REFERRING DIAG: L foot/ankle pain   THERAPY DIAG:  Pain in left ankle and joints of left foot  Muscle weakness (generalized)  Rationale for Evaluation and Treatment: Rehabilitation  ONSET DATE:   SUBJECTIVE:   SUBJECTIVE STATEMENT: Pt has been running 4-5 miles at a time. Has not been as worried about ankle giving way. Did feel pain in L achilles one day. Still feeling soreness at medial ankle with stretching.     Pt states L ankle pain, states he rolled it 10 years ago, when he was trail running.   About 1 mo ago ankle became more sore. He was Doing more stairs at the time-states weakness sensation when going down steps, like ankle is going to give way.  He still runs Trails, about 4 mi, 6 days/wk. 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  Most  pain at medial malleolus, some lateral pain and some on achilles.    PERTINENT HISTORY: Pagets disease,   PAIN:  Are you having pain? Yes: NPRS scale: 3/10 Pain location: L  ankle.  Pain description: weak, sore Aggravating factors: going down steps, increased activity, walk, run.  Relieving factors: none stated.   PRECAUTIONS: None  WEIGHT BEARING RESTRICTIONS: No  FALLS:  Has patient fallen in last 6 months? Yes. Number of falls a few "trips" on roots, when trail running    PLOF: Independent  PATIENT GOALS: Strength, confident with strength and doing down steps, not to give way. Would like to run longer [place].   NEXT MD VISIT:   OBJECTIVE:   DIAGNOSTIC FINDINGS:   PATIENT SURVEYS:  FOTO- 74.9  COGNITION: Overall cognitive status: Within functional limits for tasks assessed     SENSATION: WFL  EDEMA: none   POSTURE: No Significant postural limitations Feet:  neutral foot posture bilaterally   PALPATION: Minimal pain to palpate medial, lateral, or achilles today.   LOWER EXTREMITY ROM:  Hip, knee ROM: WFL Ankle: 7 deg DF on L.    LOWER EXTREMITY MMT:  Hips: 4+/5,  Knee: 5/5 Ankle: 4/5   LOWER EXTREMITY SPECIAL TESTS:    FUNCTIONAL TESTS:  Step  downs- pain on L, with weakness feeling with deeper DF.   GAIT:  unremarkable    TODAY'S TREATMENT:                                                                                                                              DATE:    09/06/2022 Therapeutic Exercise: Aerobic: Supine:   Seated:  Standing: Heel raises x 20;   Squats 10lb x 15;  step ups 6 in x 10 bil;  Step downs 6 in x 10 bil; Squat/side steps GTB 25 ft x 4;  Stretches:  Gastroc and soleus stretch at wall x 3 ea bil; DF glides on step x 15 on L  Neuromuscular Re-education:   SLS with UE rotation x 10 bil;  SLS on air ex x 2 bil;  Marching on AirEx x 20;  Manual Therapy:  L ankle mobs to inc DF,  Distraction on L;  Therapeutic  Activity: Self Care:   PATIENT EDUCATION:  Education details: updated and reviewed HEP Person educated: Patient Education method: Explanation, Demonstration, Tactile cues, Verbal cues, and Handouts Education comprehension: verbalized understanding, returned demonstration, verbal cues required, tactile cues required, and needs further education   HOME EXERCISE PROGRAM: Access Code: [email or web address] URL: [email or web address] Date: 09/06/2022 Prepared by: Sedalia Muta  Exercises - Standing Ankle Dorsiflexion Stretch on Chair  - 1-2 x daily - 1-2 sets - 10 reps - Gastroc Stretch on Wall  - 1-2 x daily - 3 reps - 30 hold - Heel Raises with Counter Support  - 1 x daily - 2-3 sets - 10 reps - Single Leg Stance  - 1 x daily - 1 sets - 10 reps - Step Up  - 1 x daily - 3-4 x weekly - 2 sets - 10 reps - Side Stepping with Resistance at Ankles  - 1 x daily - 3-4 x weekly - 2 sets - 10 reps - Squat  - 1 x daily - 3-4 x weekly - 2 sets - 10 reps  ASSESSMENT:  CLINICAL IMPRESSION: Pt with good tolerance for exercises today. He has good ability for squat, and loading DF in mild/moderate ranges. He does have mild soreness with increased motion in this position. DF glides on step with increased ROM and only mild pain today, Reviewed other strength and stability exercises for pt to continue. Will benefit from continued care, plan to work more on manual/mobilization next visit.   Eval: Patient presents with primary complaint of increased pain in L ankle. He has minimal pain to palpate or test today, but has increased discomfort with increased activity. He has mild stiffness for DF, and mild weakness and instability in ankle. He will benefit from education on this as well as HEP and recommendations for running. He has pain and weakness with descending stairs, and will benefit from work on this as well. Pt to benefit from skilled PT to improve deficits and pain.   OBJECTIVE IMPAIRMENTS:  decreased  activity tolerance, decreased ROM, decreased strength, improper body mechanics, and pain.   ACTIVITY LIMITATIONS: standing, squatting, stairs, and locomotion level  PARTICIPATION LIMITATIONS: cleaning, shopping, community activity, and yard work  PERSONAL FACTORS: 1 comorbidity: OA and padgets  are also affecting patient's functional outcome.   REHAB POTENTIAL: Good  CLINICAL DECISION MAKING: Stable/uncomplicated  EVALUATION COMPLEXITY: Low   GOALS: Goals reviewed with patient?yes  SHORT TERM GOALS: Target date: 08/28/2022   Pt to be independent with initial HEP Goal status: INITIAL    LONG TERM GOALS: Target date: 10/09/2022   Pt to be independent with final HEP  Goal status: INITIAL  2.  Pt to report decreased pain in L ankle to 0-2/10 with activity   Goal status: INITIAL  3.  Pt to demo strength and stability of L ankle to be [facility] with dynamic activity and single leg activity,   Goal status: INITIAL  4.  Pt to demo ability for descending stairs without pain or deficit, to improve ability for community activity.   Goal status: INITIAL    PLAN:  PT FREQUENCY: 1-2x/week  PT DURATION: 8 weeks  PLANNED INTERVENTIONS: Therapeutic exercises, Therapeutic activity, Neuromuscular re-education, Patient/Family education, Self Care, Joint mobilization, Joint manipulation, Stair training, Orthotic/Fit training, DME instructions, Aquatic Therapy, Dry Needling, Electrical stimulation, Cryotherapy, Moist heat, Taping, Ultrasound, Ionotophoresis 4mg /ml Dexamethasone, Manual therapy,  Vasopneumatic device, Traction, Spinal manipulation, Spinal mobilization,Balance training, Gait training,    PLAN FOR NEXT SESSION: ankle mobs,    Sedalia Muta, PT, DPT 1:05 PM  09/06/22

## 2022-09-20 ENCOUNTER — Ambulatory Visit (INDEPENDENT_AMBULATORY_CARE_PROVIDER_SITE_OTHER): Payer: Medicare Other | Admitting: Physical Therapy

## 2022-09-20 DIAGNOSIS — M25572 Pain in left ankle and joints of left foot: Secondary | ICD-10-CM | POA: Diagnosis not present

## 2022-09-20 DIAGNOSIS — M6281 Muscle weakness (generalized): Secondary | ICD-10-CM

## 2022-09-20 NOTE — Therapy (Signed)
OUTPATIENT PHYSICAL THERAPY LOWER EXTREMITY TREATMENT   Patient Name: Ronnie Hawkins MRN: [id number] DOB:[birth year], 67 y.o., male Today's Date: 09/20/2022  END OF SESSION:     Past Medical History:  Diagnosis Date   Hyperlipidemia 04/23/2014   Paget disease of bone 02/26/2019   Tonsillar cyst    Eval by Dr. Ezzard Standing of ENT   Past Surgical History:  Procedure Laterality Date   EYE SURGERY     laser surgery for retina   VASECTOMY     Patient Active Problem List   Diagnosis Date Noted   Agatston coronary artery calcium score greater than 400 01/24/2022   Paget disease of bone 02/26/2019   History of prostate cancer 03/02/2016   Hyperlipidemia 04/23/2014    PCP: Tana Conch  REFERRING PROVIDER: Clementeen Graham   REFERRING DIAG: L foot/ankle pain   THERAPY DIAG:  Pain in left ankle and joints of left foot  Muscle weakness (generalized)  Rationale for Evaluation and Treatment: Rehabilitation  ONSET DATE:   SUBJECTIVE:   SUBJECTIVE STATEMENT: Pt has been running 4-5 miles at a time. Has not been as worried about ankle giving way. Did feel pain in L achilles one day. Still feeling soreness at medial ankle with stretching.     Pt states L ankle pain, states he rolled it 10 years ago, when he was trail running.   About 1 mo ago ankle became more sore. He was Doing more stairs at the time-states weakness sensation when going down steps, like ankle is going to give way.  He still runs Trails, about 4 mi, 6 days/wk. 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  Most pain at medial malleolus, some lateral pain and some on achilles.    PERTINENT HISTORY: Pagets disease,   PAIN:  Are you having pain? Yes: NPRS scale: 3/10 Pain location: L  ankle.  Pain description: weak, sore Aggravating factors: going down steps, increased activity, walk, run.  Relieving factors: none stated.   PRECAUTIONS: None  WEIGHT BEARING  RESTRICTIONS: No  FALLS:  Has patient fallen in last 6 months? Yes. Number of falls a few "trips" on roots, when trail running    PLOF: Independent  PATIENT GOALS: Strength, confident with strength and doing down steps, not to give way. Would like to run longer milage.   NEXT MD VISIT:   OBJECTIVE:   DIAGNOSTIC FINDINGS:   PATIENT SURVEYS:  FOTO Eval     : 74.9 09/20/22:    75.6  COGNITION: Overall cognitive status: Within functional limits for tasks assessed     SENSATION: WFL  EDEMA: none   POSTURE: No Significant postural limitations Feet:  neutral foot posture bilaterally   PALPATION: Minimal pain to palpate medial, lateral, or achilles today.   LOWER EXTREMITY ROM:  Hip, knee ROM: WFL Ankle: 7 deg DF on L.    LOWER EXTREMITY MMT:  Hips: 4+/5,  Knee: 5/5 Ankle: 4/5   LOWER EXTREMITY SPECIAL TESTS:    FUNCTIONAL TESTS:  Step downs- pain on L, with weakness feeling with deeper DF.   GAIT:  unremarkable    TODAY'S TREATMENT:  DATE:    09/20/2022 Therapeutic Exercise: Aerobic: Supine:   Seated:  Standing: Heel raises x 20;   Squats narrow and wider stance x 15 ea;  step ups 6 in x 10 bil;  Step downs 6 in x 10 bil;  Squat/side steps GTB 25 ft x 4;  Stretches:  Gastroc and soleus stretch at wall x 3 ea bil; DF glides on step x 15 on L  Neuromuscular Re-education:  SLS with UE rotation x 10 bil;  SLS on air ex x 2 bil;  Marching on AirEx x 20;  Manual Therapy:  L ankle mobs to inc DF,  Distraction on L;  Therapeutic Activity: Self Care:   PATIENT EDUCATION:  Education details: updated and reviewed HEP Person educated: Patient Education method: Explanation, Demonstration, Tactile cues, Verbal cues, and Handouts Education comprehension: verbalized understanding, returned demonstration, verbal cues required, tactile cues  required, and needs further education   HOME EXERCISE PROGRAM: Access Code: [email or web address] URL: [email or web address] Date: 09/06/2022 Prepared by: Sedalia Muta  Exercises - Standing Ankle Dorsiflexion Stretch on Chair  - 1-2 x daily - 1-2 sets - 10 reps - Gastroc Stretch on Wall  - 1-2 x daily - 3 reps - 30 hold - Heel Raises with Counter Support  - 1 x daily - 2-3 sets - 10 reps - Single Leg Stance  - 1 x daily - 1 sets - 10 reps - Step Up  - 1 x daily - 3-4 x weekly - 2 sets - 10 reps - Side Stepping with Resistance at Ankles  - 1 x daily - 3-4 x weekly - 2 sets - 10 reps - Squat  - 1 x daily - 3-4 x weekly - 2 sets - 10 reps  ASSESSMENT:  CLINICAL IMPRESSION: Pt with good tolerance for exercises today. He has good ability for squat, and loading DF in mild/moderate ranges. He does have mild soreness with increased motion in this position. DF glides on step with increased ROM and only mild pain today, Reviewed other strength and stability exercises for pt to continue. Will benefit from continued care, plan to work more on manual/mobilization next visit.   Eval: Patient presents with primary complaint of increased pain in L ankle. He has minimal pain to palpate or test today, but has increased discomfort with increased activity. He has mild stiffness for DF, and mild weakness and instability in ankle. He will benefit from education on this as well as HEP and recommendations for running. He has pain and weakness with descending stairs, and will benefit from work on this as well. Pt to benefit from skilled PT to improve deficits and pain.   OBJECTIVE IMPAIRMENTS: decreased activity tolerance, decreased ROM, decreased strength, improper body mechanics, and pain.   ACTIVITY LIMITATIONS: standing, squatting, stairs, and locomotion level  PARTICIPATION LIMITATIONS: cleaning, shopping, community activity, and yard work  PERSONAL FACTORS: 1 comorbidity: OA and padgets  are also  affecting patient's functional outcome.   REHAB POTENTIAL: Good  CLINICAL DECISION MAKING: Stable/uncomplicated  EVALUATION COMPLEXITY: Low   GOALS: Goals reviewed with patient?yes  SHORT TERM GOALS: Target date: 08/28/2022   Pt to be independent with initial HEP Goal status: INITIAL    LONG TERM GOALS: Target date: 10/09/2022   Pt to be independent with final HEP  Goal status: INITIAL  2.  Pt to report decreased pain in L ankle to 0-2/10 with activity   Goal status: INITIAL  3.  Pt to demo strength and stability of L  ankle to be [facility] with dynamic activity and single leg activity,   Goal status: INITIAL  4.  Pt to demo ability for descending stairs without pain or deficit, to improve ability for community activity.   Goal status: INITIAL    PLAN:  PT FREQUENCY: 1-2x/week  PT DURATION: 8 weeks  PLANNED INTERVENTIONS: Therapeutic exercises, Therapeutic activity, Neuromuscular re-education, Patient/Family education, Self Care, Joint mobilization, Joint manipulation, Stair training, Orthotic/Fit training, DME instructions, Aquatic Therapy, Dry Needling, Electrical stimulation, Cryotherapy, Moist heat, Taping, Ultrasound, Ionotophoresis 4mg /ml Dexamethasone, Manual therapy,  Vasopneumatic device, Traction, Spinal manipulation, Spinal mobilization,Balance training, Gait training,    PLAN FOR NEXT SESSION: ankle mobs,    Sedalia Muta, PT, DPT 8:50 AM  09/20/22

## 2022-09-21 ENCOUNTER — Encounter: Payer: Self-pay | Admitting: Family Medicine

## 2022-09-21 ENCOUNTER — Encounter: Payer: Self-pay | Admitting: Physical Therapy

## 2022-09-26 ENCOUNTER — Encounter: Payer: Self-pay | Admitting: Family Medicine

## 2022-09-27 NOTE — Telephone Encounter (Signed)
Vaccine documented.

## 2022-10-03 ENCOUNTER — Encounter: Payer: Self-pay | Admitting: Family Medicine

## 2022-10-03 ENCOUNTER — Telehealth: Payer: Medicare Other

## 2022-10-03 ENCOUNTER — Telehealth: Payer: Self-pay | Admitting: Family Medicine

## 2022-10-03 NOTE — Telephone Encounter (Signed)
Patient states he is traveling to [place] on August 8th and was informed he needed vaccines against typhoid and cholera. States he also wanted to know what other vaccines would be recommended. Pt does have an OV with Jarold Motto on 7/29 to discuss and potentially get the vaccines. Please advise on any different actions as needed.

## 2022-10-04 NOTE — Telephone Encounter (Signed)
See below regarding Rx.

## 2022-10-04 NOTE — Telephone Encounter (Signed)
Havlyn, we do not carry those vaccines here. Pt will need to contact Health Dept or [facility]. Also [facility].  They usually do not have a wait.  The number is [phone].

## 2022-10-04 NOTE — Telephone Encounter (Signed)
Please have Dr. Durene Cal send prescriptions. Thanks

## 2022-10-04 NOTE — Telephone Encounter (Signed)
Informed pt of Donna's message below and he verbalized understanding. States he actually called pharmacy last night who informed him they could do the vaccines if they have a rx order for it. Patient requests prescription order for typhoid and cholera vaccine be sent to [facility] @ [address]. 7/29 OV has been canceled.

## 2022-10-04 NOTE — Telephone Encounter (Signed)
From other thread "I know there was a thread about travel clinics today- can you give him that information? I have prescribed typhoid in a pinch before but don't have any cholera experience and not sure where it can be found and also if he might have other needs that he is not aware of "  Tammy sent out an email about this- they do full counseling on travel and not just limited to the vaccines that are required so I generally advise that as first step. If he declines I can at least do typhoid. Once again no experience with cholera but I can try to look it up if needed

## 2022-10-09 ENCOUNTER — Ambulatory Visit: Payer: Medicare Other | Admitting: Physician Assistant

## 2022-10-11 ENCOUNTER — Encounter (HOSPITAL_BASED_OUTPATIENT_CLINIC_OR_DEPARTMENT_OTHER): Payer: Self-pay

## 2022-12-27 ENCOUNTER — Encounter: Payer: Self-pay | Admitting: Family Medicine

## 2023-01-02 ENCOUNTER — Encounter: Payer: Self-pay | Admitting: Family Medicine

## 2023-01-02 ENCOUNTER — Other Ambulatory Visit: Payer: Self-pay

## 2023-01-02 DIAGNOSIS — R32 Unspecified urinary incontinence: Secondary | ICD-10-CM

## 2023-01-07 ENCOUNTER — Ambulatory Visit (HOSPITAL_COMMUNITY)
Admission: EM | Admit: 2023-01-07 | Discharge: 2023-01-07 | Disposition: A | Discharge status: Home / Self Care | Payer: Medicare Other | Attending: Family Medicine | Admitting: Family Medicine

## 2023-01-07 ENCOUNTER — Encounter (HOSPITAL_COMMUNITY): Payer: Self-pay

## 2023-01-07 DIAGNOSIS — J069 Acute upper respiratory infection, unspecified: Secondary | ICD-10-CM | POA: Diagnosis not present

## 2023-01-07 MED ORDER — BENZONATATE 200 MG PO CAPS: 200.0000 mg | ORAL_CAPSULE | Freq: Three times a day (TID) | ORAL | 0 refills | Status: DC | PRN

## 2023-01-07 NOTE — ED Provider Notes (Signed)
[facility]    CSN: [id number] Arrival date & time: 01/07/23  1030      History   Chief Complaint Chief Complaint  Patient presents with   Sore Throat   Cough    HPI Ronnie Hawkins is a 67 y.o. male.    Sore Throat  Cough Associated symptoms: rhinorrhea and sore throat    Patient is here for URI symptoms.  He started with a sore throat and cough 4 days ago.  He is having worsening sore throat, hurts to swallow, feels "constricted".  Today he is producing more phlegm.  No wheezing or sob.  No fevers/chills.  He is taking tylenol before bed, but nothing else.        Past Medical History:  Diagnosis Date   Hyperlipidemia 04/23/2014   Paget disease of bone 02/26/2019   Tonsillar cyst    Eval by Dr. Ezzard Standing of ENT    Patient Active Problem List   Diagnosis Date Noted   Agatston coronary artery calcium score greater than 400 01/24/2022   Paget disease of bone 02/26/2019   History of prostate cancer 03/02/2016   Hyperlipidemia 04/23/2014    Past Surgical History:  Procedure Laterality Date   EYE SURGERY     laser surgery for retina   VASECTOMY         Home Medications    Prior to Admission medications   Medication Sig Start Date End Date Taking? Authorizing Provider  aspirin EC 81 MG tablet Take 1 tablet (81 mg total) by mouth daily. Swallow whole. 02/08/22   Jodelle Red, MD  Multiple Vitamin (MULTI-VITAMINS) TABS Take 1 tablet by mouth daily.    [provider]  rosuvastatin (CRESTOR) 20 MG tablet Take 1 tablet (20 mg total) by mouth daily. 06/20/22 06/15/23  Jodelle Red, MD    Family History Family History  Problem Relation Age of Onset   Cancer Mother        breast CA   Diabetes Mother    Cancer Father 24       liver- alcohol related   Heart attack Father 62   Diabetes Father    Diabetes Brother    CAD Brother        too long for stents    Social History Social History   Tobacco Use   Smoking  status: Never   Smokeless tobacco: Never  Vaping Use   Vaping status: Never Used  Substance Use Topics   Alcohol use: No    Alcohol/week: 0.0 standard drinks of alcohol   Drug use: No     Allergies   Patient has no known allergies.   Review of Systems Review of Systems  Constitutional: Negative.   HENT:  Positive for congestion, rhinorrhea and sore throat.   Respiratory:  Positive for cough.   Gastrointestinal: Negative.   Musculoskeletal: Negative.   Psychiatric/Behavioral: Negative.       Physical Exam Triage Vital Signs ED Triage Vitals  Encounter Vitals Group     BP 01/07/23 1046 (!) 161/80     Systolic BP Percentile --      Diastolic BP Percentile --      Pulse Rate 01/07/23 1047 72     Resp 01/07/23 1047 18     Temp 01/07/23 1047 97.9 F (36.6 C)     Temp Source 01/07/23 1047 Oral     SpO2 01/07/23 1047 94 %     Weight --      Height --  Head Circumference --      Peak Flow --      Pain Score 01/07/23 1048 0     Pain Loc --      Pain Education --      Exclude from Growth Chart --    No data found.  Updated Vital Signs BP (!) 148/67 (BP Location: Right Arm)   Pulse 72   Temp 97.9 F (36.6 C) (Oral)   Resp 18   SpO2 94%   Visual Acuity Right Eye Distance:   Left Eye Distance:   Bilateral Distance:    Right Eye Near:   Left Eye Near:    Bilateral Near:     Physical Exam Constitutional:      Appearance: He is well-developed.  HENT:     Nose: Rhinorrhea present. No congestion.     Mouth/Throat:     Mouth: Mucous membranes are moist. No oral lesions.     Pharynx: No pharyngeal swelling, oropharyngeal exudate or posterior oropharyngeal erythema.     Tonsils: No tonsillar exudate.  Cardiovascular:     Rate and Rhythm: Normal rate and regular rhythm.     Heart sounds: Normal heart sounds.  Pulmonary:     Effort: Pulmonary effort is normal.     Breath sounds: Normal breath sounds. No wheezing or rhonchi.  Musculoskeletal:     Cervical  back: Normal range of motion and neck supple.  Lymphadenopathy:     Cervical: No cervical adenopathy.  Neurological:     Mental Status: He is alert.      UC Treatments / Results  Labs (all labs ordered are listed, but only abnormal results are displayed) Labs Reviewed - No data to display  EKG   Radiology No results found.  Procedures Procedures (including critical care time)  Medications Ordered in UC Medications - No data to display  Initial Impression / Assessment and Plan / UC Course  I have reviewed the triage vital signs and the nursing notes.  Pertinent labs & imaging results that were available during my care of the patient were reviewed by me and considered in my medical decision making (see chart for details).   Final Clinical Impressions(s) / UC Diagnoses   Final diagnoses:  Upper respiratory tract infection, unspecified type     Discharge Instructions      You were seen today for upper respiratory symptoms.  This appears viral at this time.  I have sent out a medication to help with cough.  I recommend over the counter claritin/zyrtec as well.  Please return if not improving or worsening.     ED Prescriptions     Medication Sig Dispense Auth. Provider   benzonatate (TESSALON) 200 MG capsule Take 1 capsule (200 mg total) by mouth 3 (three) times daily as needed for cough. 21 capsule Jannifer Franklin, MD      PDMP not reviewed this encounter.   Jannifer Franklin, MD 01/07/23 1101

## 2023-01-07 NOTE — Discharge Instructions (Signed)
You were seen today for upper respiratory symptoms.  This appears viral at this time.  I have sent out a medication to help with cough.  I recommend over the counter claritin/zyrtec as well.  Please return if not improving or worsening.

## 2023-01-07 NOTE — ED Triage Notes (Signed)
Pt presents with a sore throat "it has gotten so bad, I feel like I cannot swallow at times." Pt also reporting productive cough and "some congestion." Symptoms began Wednesday 10/23. 500 mg Tylenol & cough drops taken each day (except today) with little to no improvement in symptoms.

## 2023-01-22 NOTE — Progress Notes (Signed)
Cardiology Office Note:  .   Date:  01/23/2023  ID:  Ronnie Hawkins, DOB [birth year], MRN [id number] PCP: Shelva Majestic, MD  [facility] Providers Cardiologist:  Jodelle Red, MD {  History of Present Illness: .   Ronnie Hawkins is a 67 y.o. male with a hx of nonobstructive CAD by Ca score, hyperlipidemia and Paget disease who is seen for follow up today. I met him 02/08/22 as a new consult at the request of Shelva Majestic, MD for the evaluation and management of elevated coronary artery calcium score.   Pertinent CV history: Ca score 513. Has been told he has hyperlipidemia since age 70. He was initially prescribed 10 mg atorvastatin a week, but his dosage was increased to 20 mg. He experienced side effects such as nausea and lightheadedness. He recalls taking baby aspirin up until age 3. Family history: His brother had a MI. Exercise level: For exercise, he runs 20-25 miles a week, with no symptoms.  Today: Doing well overall. Most recent LDL 70. Tolerating rosuvastatin. Seen in the ER for URI two weeks ago, symptoms slow to improve, still has a cough. His BP was very high in the ER but typically runs in the 90s-100s systolic, as it is today. Still very active, running regularly without any symptoms. Aims to run 1000 miles/year, and he is on track to finish early this year.  ROS: Denies chest pain, shortness of breath at rest or with normal exertion. No PND, orthopnea, LE edema or unexpected weight gain. No syncope or palpitations. ROS otherwise negative except as noted.   Studies Reviewed: Marland Kitchen    EKG:  EKG Interpretation Date/Time:  Tuesday January 23 2023 08:16:38 EST Ventricular Rate:  52 PR Interval:  152 QRS Duration:  102 QT Interval:  438 QTC Calculation: 407 R Axis:   55  Text Interpretation: Sinus bradycardia Incomplete right bundle branch block Confirmed by Jodelle Red [phone]) on 01/23/2023 8:19:18 AM    Physical Exam:   VS:  BP 94/70 (BP  Location: Left Arm, Patient Position: Sitting, Cuff Size: Normal)   Pulse (!) 52   Ht 5\' 8"  (1.727 m)   Wt 145 lb 3.2 oz (65.9 kg)   SpO2 96%   BMI 22.08 kg/m    Wt Readings from Last 3 Encounters:  01/23/23 145 lb 3.2 oz (65.9 kg)  08/11/22 150 lb (68 kg)  07/25/22 145 lb 3.2 oz (65.9 kg)    GEN: Well nourished, well developed in no acute distress HEENT: Normal, moist mucous membranes NECK: No JVD CARDIAC: regular rhythm, normal S1 and S2, no rubs or gallops. No murmur. VASCULAR: Radial and DP pulses 2+ bilaterally. No carotid bruits RESPIRATORY:  Clear to auscultation without rales, wheezing or rhonchi  ABDOMEN: Soft, non-tender, non-distended MUSCULOSKELETAL:  Ambulates independently SKIN: Warm and dry, no edema NEUROLOGIC:  Alert and oriented x 3. No focal neuro deficits noted. PSYCHIATRIC:  Normal affect    ASSESSMENT AND PLAN: .    Elevated coronary calcium score, consistent with nonobstructive CAD Hypercholesterolemia Statin myalgia -continue aspirin -tolerating rosuvastatin, did not tolerate atorvastatin. LDL 07/2022 70 -discussed red flag warning signs that need immediate medical attention -discussed potential exercise treadmill stress given elevated calcium score. Given his high level of activity, we will instead monitor for any decreased exercise tolerance or development of symptoms -lp(a) normal at 34.5  CV risk counseling and prevention -recommend heart healthy/Mediterranean diet, with whole grains, fruits, vegetable, fish, lean meats, nuts, and olive oil. Limit salt. -  recommend moderate walking, 3-5 times/week for 30-50 minutes each session. Aim for at least 150 minutes.week. Goal should be pace of 3 miles/hours, or walking 1.5 miles in 30 minutes -recommend avoidance of tobacco products. Avoid excess alcohol.  Dispo: 1 year or sooner as needed  Signed, Jodelle Red, MD   Jodelle Red, MD, PhD, [facility]  [facility]  [place]   [facility] at [facility] [address], [address] [place], [place] [address] [phone]

## 2023-01-23 ENCOUNTER — Ambulatory Visit (INDEPENDENT_AMBULATORY_CARE_PROVIDER_SITE_OTHER): Payer: Medicare Other | Admitting: Cardiology

## 2023-01-23 ENCOUNTER — Encounter (HOSPITAL_BASED_OUTPATIENT_CLINIC_OR_DEPARTMENT_OTHER): Payer: Self-pay | Admitting: Cardiology

## 2023-01-23 VITALS — BP 94/70 | HR 52 | Ht 68.0 in | Wt 145.2 lb

## 2023-01-23 DIAGNOSIS — Z7189 Other specified counseling: Secondary | ICD-10-CM

## 2023-01-23 DIAGNOSIS — E7800 Pure hypercholesterolemia, unspecified: Secondary | ICD-10-CM

## 2023-01-23 DIAGNOSIS — T466X5D Adverse effect of antihyperlipidemic and antiarteriosclerotic drugs, subsequent encounter: Secondary | ICD-10-CM

## 2023-01-23 DIAGNOSIS — M791 Myalgia, unspecified site: Secondary | ICD-10-CM

## 2023-01-23 DIAGNOSIS — R931 Abnormal findings on diagnostic imaging of heart and coronary circulation: Secondary | ICD-10-CM | POA: Diagnosis not present

## 2023-01-23 DIAGNOSIS — I2510 Atherosclerotic heart disease of native coronary artery without angina pectoris: Secondary | ICD-10-CM | POA: Diagnosis not present

## 2023-01-23 DIAGNOSIS — T466X5A Adverse effect of antihyperlipidemic and antiarteriosclerotic drugs, initial encounter: Secondary | ICD-10-CM | POA: Insufficient documentation

## 2023-01-23 NOTE — Patient Instructions (Signed)

## 2023-02-03 ENCOUNTER — Encounter (HOSPITAL_BASED_OUTPATIENT_CLINIC_OR_DEPARTMENT_OTHER): Payer: Self-pay

## 2023-03-29 ENCOUNTER — Encounter: Payer: Self-pay | Admitting: Family Medicine

## 2023-03-29 ENCOUNTER — Ambulatory Visit: Payer: Medicare Other

## 2023-03-29 ENCOUNTER — Ambulatory Visit (INDEPENDENT_AMBULATORY_CARE_PROVIDER_SITE_OTHER): Payer: Medicare Other | Admitting: Family Medicine

## 2023-03-29 VITALS — BP 126/68 | HR 66 | Temp 97.4°F | Ht 68.0 in | Wt 145.4 lb

## 2023-03-29 DIAGNOSIS — R053 Chronic cough: Secondary | ICD-10-CM

## 2023-03-29 DIAGNOSIS — E785 Hyperlipidemia, unspecified: Secondary | ICD-10-CM | POA: Diagnosis not present

## 2023-03-29 DIAGNOSIS — R931 Abnormal findings on diagnostic imaging of heart and coronary circulation: Secondary | ICD-10-CM

## 2023-03-29 MED ORDER — OMEPRAZOLE 40 MG PO CPDR: 40.0000 mg | DELAYED_RELEASE_CAPSULE | Freq: Every day | ORAL | 1 refills | Status: DC

## 2023-03-29 MED ORDER — FLUTICASONE PROPIONATE 50 MCG/ACT NA SUSP: 2.0000 | Freq: Every day | NASAL | 1 refills | Status: DC

## 2023-03-29 NOTE — Progress Notes (Signed)
Phone [phone] In person visit   Subjective:   Ronnie Hawkins is a 68 y.o. year old very pleasant male patient who presents for/with See problem oriented charting Chief Complaint  Patient presents with   Cough    Pt c/o persistant cough since Oct that is dry, denies fever.     Past Medical History-  Patient Active Problem List   Diagnosis Date Noted   Agatston coronary artery calcium score greater than 400 01/24/2022    Priority: High   Hyperlipidemia 04/23/2014    Priority: Medium    Paget disease of bone 02/26/2019    Priority: Low   History of prostate cancer 03/02/2016    Priority: Low   Nonocclusive coronary atherosclerosis of native coronary artery 01/23/2023   Myalgia due to statin 01/23/2023    Medications- reviewed and updated Current Outpatient Medications  Medication Sig Dispense Refill   aspirin EC 81 MG tablet Take 1 tablet (81 mg total) by mouth daily. Swallow whole. 90 tablet 3   fluticasone (FLONASE) 50 MCG/ACT nasal spray Place 2 sprays into both nostrils daily. 16 g 1   Multiple Vitamin (MULTI-VITAMINS) TABS Take 1 tablet by mouth daily.     omeprazole (PRILOSEC) 40 MG capsule Take 1 capsule (40 mg total) by mouth daily. 30 capsule 1   rosuvastatin (CRESTOR) 20 MG tablet Take 1 tablet (20 mg total) by mouth daily. 90 tablet 3   No current facility-administered medications for this visit.     Objective:  BP 126/68   Pulse 66   Temp (!) 97.4 F (36.3 C)   Ht 5\' 8"  (1.727 m)   Wt 145 lb 6.4 oz (66 kg)   SpO2 97%   BMI 22.11 kg/m  Gen: NAD, resting comfortably Nasal turbinates edematous, some cobblestoning of the pharynx-mild erythema noted CV: RRR no murmurs rubs or gallops Lungs: CTAB no crackles, wheeze, rhonchi Ext: no edema Skin: warm, dry     Assessment and Plan   # Chronic cough S: Patient reports cough since October.  Cough is dry as far as nothing comes up but feels congestion in central chest- some throat clearing sensation but  rather depression almost into the chest.  Denies fever.  Denies shortness of breath.  Deep breath with a cough gives temporary relief then returns. Tessalon did not help.  -Saw urgent care with [facility] on October 27th and diagnosed as upper respiratory infection (URI) and given tessalon- some improvement since then - has been able to still run in October and November without issues. 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 Sherene Sires most likely with pulmonary for chronic cough   #CT calcium scoring-514 on 01/17/2022 which is 83rd percentile and family history of  MI in 33s and father as well as brother Jesusita Oka at age 47 #hyperlipidemia S: Medication:Rosuvastatin 20 mg daily, aspirin 81 mg -Lipoprotein a not elevated -has seen Dr. Cristal Deer Lab Results  Component Value Date   CHOL 144 08/09/2022   HDL 59 08/09/2022   LDLCALC 70 08/09/2022   LDLDIRECT 138.2 09/24/2009   TRIG 80 08/09/2022   CHOLHDL 2.4 08/09/2022   A/P: Hyperlipidemia with known coronary artery calcium-LDL has been at goal-continue current medication with rosuvastatin 20 mg daily and aspirin  #balance issues- saw neurology 2020- see note- mild degradation  since that time of balance -Also had 1 day in recent months with more vertiginous symptoms for several hours-no recurrence but would encourage follow-up if recurs-hard to tell based off of prior vertigo weight may have been  Recommended follow up: Return for as needed for new, worsening, persistent symptoms. Future Appointments  Date Time Provider Department Center  08/29/2023  8:00 AM Shelva Majestic, MD [facility]    Lab/Order associations:   ICD-10-CM   1. Chronic cough  R05.3 DG Chest 2 View    2. Hyperlipidemia, unspecified hyperlipidemia type  E78.5     3. Agatston coronary artery calcium score greater than 400  R93.1       Meds ordered this encounter  Medications   fluticasone (FLONASE) 50 MCG/ACT nasal spray    Sig: Place 2 sprays into both nostrils daily.    Dispense:  16 g    Refill:  1   omeprazole (PRILOSEC) 40 MG capsule    Sig: Take 1 capsule (40 mg total) by mouth daily.    Dispense:  30 capsule    Refill:  1    Return precautions advised.  Tana Conch, MD

## 2023-03-29 NOTE — Patient Instructions (Addendum)
Chest x-ray today before you leave -may take up to 10 business days on read  Trial Flonase for possible allergies when looking at throat irriation Trial omeprazole 40 mg in case reflux element  If not making substantial progress in next 4-6 weeks let me refer you to pulmonary - reach out by mychart or call us. If worsens please let us know sooner or if you get fevers or shortness of breath

## 2023-04-04 ENCOUNTER — Encounter: Payer: Self-pay | Admitting: Family Medicine

## 2023-04-10 ENCOUNTER — Encounter: Payer: Self-pay | Admitting: Family Medicine

## 2023-04-12 ENCOUNTER — Ambulatory Visit: Payer: Medicare Other | Admitting: Family Medicine

## 2023-04-20 ENCOUNTER — Other Ambulatory Visit: Payer: Self-pay | Admitting: Family Medicine

## 2023-05-16 ENCOUNTER — Encounter: Payer: Self-pay | Admitting: Family Medicine

## 2023-05-16 ENCOUNTER — Ambulatory Visit: Payer: Medicare Other | Admitting: Family Medicine

## 2023-05-16 VITALS — BP 120/72 | HR 71 | Temp 97.3°F | Ht 68.0 in | Wt 142.8 lb

## 2023-05-16 DIAGNOSIS — R931 Abnormal findings on diagnostic imaging of heart and coronary circulation: Secondary | ICD-10-CM

## 2023-05-16 DIAGNOSIS — I2510 Atherosclerotic heart disease of native coronary artery without angina pectoris: Secondary | ICD-10-CM

## 2023-05-16 DIAGNOSIS — E785 Hyperlipidemia, unspecified: Secondary | ICD-10-CM | POA: Diagnosis not present

## 2023-05-16 DIAGNOSIS — Z8546 Personal history of malignant neoplasm of prostate: Secondary | ICD-10-CM | POA: Diagnosis not present

## 2023-05-16 NOTE — Patient Instructions (Addendum)
 Hope you have fun and stay safe and enjoy the journey!   Recommended follow up: Return for next already scheduled visit or sooner if needed.

## 2023-05-16 NOTE — Progress Notes (Signed)
 Phone [phone] In person visit   Subjective:   Ronnie Hawkins is a 68 y.o. year old very pleasant male patient who presents for/with See problem oriented charting Chief Complaint  Patient presents with   medical assessment    [place] in [place] and they require a certification of good health form completed.   Past Medical History-  Patient Active Problem List   Diagnosis Date Noted   Agatston coronary artery calcium score greater than 400 01/24/2022    Priority: High   Hyperlipidemia 04/23/2014    Priority: Medium    Paget disease of bone 02/26/2019    Priority: Low   History of prostate cancer 03/02/2016    Priority: Low   Nonocclusive coronary atherosclerosis of native coronary artery 01/23/2023   Myalgia due to statin 01/23/2023    Medications- reviewed and updated Current Outpatient Medications  Medication Sig Dispense Refill   aspirin EC 81 MG tablet Take 1 tablet (81 mg total) by mouth daily. Swallow whole. 90 tablet 3   Multiple Vitamin (MULTI-VITAMINS) TABS Take 1 tablet by mouth daily.     rosuvastatin (CRESTOR) 20 MG tablet Take 1 tablet (20 mg total) by mouth daily. 90 tablet 3   tadalafil (CIALIS) 5 MG tablet Take 5 mg by mouth daily. To help with incontinence after prostatectomy - Dr. Mena Goes     No current facility-administered medications for this visit.     Objective:  BP 120/72   Pulse 71   Temp (!) 97.3 F (36.3 C)   Ht 5\' 8"  (1.727 m)   Wt 142 lb 12.8 oz (64.8 kg)   SpO2 97%   BMI 21.71 kg/m  Gen: NAD, resting comfortably CV: RRR no murmurs rubs or gallops Lungs: CTAB no crackles, wheeze, rhonchi Abdomen: soft/nontender/nondistended/normal bowel sounds. No rebound or guarding.  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- he is in good health and we will write letter of  support  Immunization History  Administered Date(s) Administered   Fluad Quad(high Dose 65+) 11/27/2021   Hepatitis A 02/14/1996, 02/23/1997   Hepatitis B 02/14/1996, 08/17/1996, 02/23/1997   Influenza, High Dose Seasonal PF 12/18/2022   Influenza,inj,Quad PF,6+ Mos 12/10/2014   Influenza-Unspecified 12/11/2012, 12/29/2013, 12/13/2015, 12/28/2016, 12/17/2017, 12/12/2018   MMR 09/28/1987   Moderna Covid-19 Fall Seasonal Vaccine 37yrs & older 12/18/2022   Moderna SARS-COV2 Booster Vaccination 12/12/2021   Moderna Sars-Covid-2 Vaccination 05/12/2019, 06/10/2019, 02/03/2020, 09/03/2020   PNEUMOCOCCAL CONJUGATE-20 01/24/2022   PPD Test 05/06/2014, 08/23/2022   Respiratory Syncytial Virus Vaccine,Recomb Aduvanted(Arexvy) 11/27/2021   Td 07/08/2008   Tdap 08/15/2018   Typhoid Inactivated 08/22/2016, 07/17/2019, 10/16/2022   Zoster Recombinant(Shingrix) 09/20/2022, 12/18/2022  - up to date on vaccines but did recommend hepatitis A at pharmacy  - will be in [place], [place]- not listed in transmission area for malaria  #CT calcium scoring-514 on 01/17/2022 which is 83rd percentile and family history of MI in 72s and father as well as brother Jesusita Oka at age 73-has seen Dr. Cristal Deer #hyperlipidemia S: Medication:Rosuvastatin 20 mg daily, aspirin 81 mg -Lipoprotein a not elevated Lab Results  Component Value Date   CHOL 144 08/09/2022   HDL 59 08/09/2022   LDLCALC 70 08/09/2022   LDLDIRECT 138.2 09/24/2009   TRIG 80 08/09/2022   CHOLHDL 2.4 08/09/2022   A/P: Lipids right at  goal even for coronary artery calcium with LDL of 70-with upcoming travel offered early lipid panel and LFTs- but will be able to come back in june   #history of prostate cancer- 10/22/15 robotic prostatectomy [place] doctor. We monitor PSA and levels that remain very low-since he will be traveling offered repeat PSA at this time- but he reports should be back each June - on tadalafil 5 mg daily to help with  incontinence through Dr. Mena Goes  Lab Results  Component Value Date   PSA 0.01 (L) 07/26/2022   PSA 0.01 (L) 11/28/2021   PSA 0.02 (L) 09/15/2020   # History of chronic cough-noted at January 16 visit.  3 months of symptoms at that point.  We added Flonase to see if allergic element and omeprazole 40 mg in case there is a reflux element.  Chest x-ray was reassuring-today he reports this did help but did recently have a cold and flared symptoms but improving again. No hemoptysis -risk of communicable disease is low- likely irirtant from reflux/allergies and flare of recent cold.    Recommended follow up: Return for next already scheduled visit or sooner if needed. Future Appointments  Date Time Provider Department Center  08/29/2023  8:00 AM Shelva Majestic, MD [facility]    Lab/Order associations:   ICD-10-CM   1. Hyperlipidemia, unspecified hyperlipidemia type  E78.5     2. Nonocclusive coronary atherosclerosis of native coronary artery  I25.10     3. History of prostate cancer  Z85.46     4. Agatston coronary artery calcium score greater than 400  R93.1       No orders of the defined types were placed in this encounter.   Return precautions advised.  Tana Conch, MD

## 2023-06-09 ENCOUNTER — Other Ambulatory Visit: Payer: Self-pay | Admitting: Cardiology

## 2023-06-09 DIAGNOSIS — I2510 Atherosclerotic heart disease of native coronary artery without angina pectoris: Secondary | ICD-10-CM

## 2023-08-14 ENCOUNTER — Encounter (HOSPITAL_BASED_OUTPATIENT_CLINIC_OR_DEPARTMENT_OTHER): Payer: Self-pay

## 2023-08-14 ENCOUNTER — Encounter: Payer: Self-pay | Admitting: Family Medicine

## 2023-08-29 ENCOUNTER — Ambulatory Visit: Payer: Self-pay | Admitting: Family Medicine

## 2023-08-29 ENCOUNTER — Encounter: Payer: Self-pay | Admitting: Family Medicine

## 2023-08-29 VITALS — BP 100/64 | HR 60 | Temp 97.6°F | Ht 68.0 in | Wt 142.2 lb

## 2023-08-29 DIAGNOSIS — G720 Drug-induced myopathy: Secondary | ICD-10-CM | POA: Diagnosis not present

## 2023-08-29 DIAGNOSIS — Z0000 Encounter for general adult medical examination without abnormal findings: Secondary | ICD-10-CM

## 2023-08-29 DIAGNOSIS — Z8546 Personal history of malignant neoplasm of prostate: Secondary | ICD-10-CM

## 2023-08-29 DIAGNOSIS — Z789 Other specified health status: Secondary | ICD-10-CM

## 2023-08-29 DIAGNOSIS — E785 Hyperlipidemia, unspecified: Secondary | ICD-10-CM

## 2023-08-29 DIAGNOSIS — Z2839 Other underimmunization status: Secondary | ICD-10-CM

## 2023-08-29 NOTE — Patient Instructions (Addendum)
 Please stop by lab before you go If you have mychart- we will send your results within 3 business days of us  receiving them.  If you do not have mychart- we will call you about results within 5 business days of us  receiving them.  *please also note that you will see labs on mychart as soon as they post. I will later go in and write notes on them- will say notes from Dr. Arlene Ben   Have a safe and wonderful next year!   Recommended follow up: Return in about 1 year (around 08/28/2024) for followup or sooner if needed.Schedule b4 you leave.

## 2023-08-29 NOTE — Progress Notes (Signed)
 Phone: [phone]   Subjective:  Patient presents today for their annual follow-up. Chief complaint-noted.   See problem oriented charting-  The following were reviewed and entered/updated in epic: Past Medical History:  Diagnosis Date   Hyperlipidemia 04/23/2014   Paget disease of bone 02/26/2019   Tonsillar cyst    Eval by Dr. Odean [place] of [facility]   Patient Active Problem List   Diagnosis Date Noted   Agatston coronary artery calcium  score greater than 400 01/24/2022    Priority: High   Hyperlipidemia 04/23/2014    Priority: Medium    Paget disease of bone 02/26/2019    Priority: Low   History of prostate cancer 03/02/2016    Priority: Low   Nonocclusive coronary atherosclerosis of native coronary artery 01/23/2023   Myalgia due to statin 01/23/2023   Past Surgical History:  Procedure Laterality Date   EYE SURGERY     laser surgery for retina   VASECTOMY      Family History  Problem Relation Age of Onset   Cancer Mother        breast CA   Diabetes Mother    Cancer Father 41       liver- alcohol related   Heart attack Father 55   Diabetes Father    Diabetes Brother    CAD Brother        too long for stents    Medications- reviewed and updated Current Outpatient Medications  Medication Sig Dispense Refill   aspirin  EC 81 MG tablet Take 1 tablet (81 mg total) by mouth daily. Swallow whole. 90 tablet 3   Multiple Vitamin (MULTI-VITAMINS) TABS Take 1 tablet by mouth daily.     rosuvastatin  (CRESTOR ) 20 MG tablet TAKE 1 TABLET BY MOUTH EVERY DAY 90 tablet 3   Vibegron (GEMTESA) 75 MG TABS Take 75 mg by mouth daily.     No current facility-administered medications for this visit.    Allergies-reviewed and updated No Known Allergies  Social History   Social History Narrative   Married. 2 sons - younger son in [place], oldest in August. no grandkids yet. No pets.       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 Back at it in 2025- [place]      Hobbies: running and reading       Patient is right-handed. He lives in a 2 level home with his wife.   Objective  Objective:  BP 100/64   Pulse 60   Temp 97.6 F (36.4 C)   Ht 5' 8 (1.727 m)   Wt 142 lb 3.2 oz (64.5 kg)   SpO2 97%   BMI 21.62 kg/m  Gen: NAD, resting comfortably HEENT: Mucous membranes are moist. Oropharynx normal Neck: no thyromegaly CV: RRR no murmurs rubs or gallops Lungs: CTAB no crackles, wheeze, rhonchi Abdomen: soft/nontender/nondistended/normal bowel sounds. No rebound or guarding.  Ext: no edema Skin: warm, dry Neuro: grossly normal, moves all extremities, PERRLA Declines genitourinary and rectal    Assessment and Plan  68 y.o. male presenting for annual follow up   Health Maintenance counseling: 1. Anticipatory guidance: Patient counseled regarding regular dental exams -q6 months, eye exams -yearly,  avoiding smoking and second hand smoke , limiting alcohol to 2 beverages per day - doesn't drink, no illicit drugs .   2. Risk factor reduction:  Advised patient of need for regular exercise and diet rich and fruits and vegetables to reduce risk of heart attack and stroke.  Exercise- 6 days a week exercise.  Diet/weight management-healthy weight and eating healthy.  Wt Readings from Last 3 Encounters:  08/29/23 142 lb 3.2 oz (64.5 kg)  05/16/23 142 lb 12.8 oz (64.8 kg)  03/29/23 145 lb 6.4 oz (66 kg)   3. Immunizations/screenings/ancillary studies- wee hepatitis a and b discussion below, COVID and flu in the fall  Immunization History  Administered Date(s) Administered   Fluad Quad(high Dose 65+) 11/27/2021   Hepatitis A 02/14/1996, 02/23/1997   Hepatitis B 02/14/1996, 08/17/1996, 02/23/1997   Influenza, High Dose Seasonal PF 12/18/2022   Influenza,inj,Quad PF,6+ Mos 12/10/2014   Influenza-Unspecified 12/11/2012,  12/29/2013, 12/13/2015, 12/28/2016, 12/17/2017, 12/12/2018   MMR 09/28/1987   Moderna Covid-19 Fall Seasonal Vaccine 60yrs & older 12/18/2022   Moderna SARS-COV2 Booster Vaccination 12/12/2021   Moderna Sars-Covid-2 Vaccination 05/12/2019, 06/10/2019, 02/03/2020, 09/03/2020   PNEUMOCOCCAL CONJUGATE-20 01/24/2022   PPD Test 05/06/2014, 08/23/2022   Respiratory Syncytial Virus Vaccine,Recomb Aduvanted(Arexvy) 11/27/2021   Td 07/08/2008   Tdap 08/15/2018   Typhoid Inactivated 08/22/2016, 07/17/2019, 10/16/2022   Zoster Recombinant(Shingrix) 09/20/2022, 12/18/2022   4. Prostate cancer screening- history of prostate cancer- check PSA- sees urology as well  Lab Results  Component Value Date   PSA 0.01 (L) 07/26/2022   PSA 0.01 (L) 11/28/2021   PSA 0.02 (L) 09/15/2020   5. Colon cancer screening - 10/02/2017 and they are going to repeat before he leaves- digestive health specialists doing sooner than 10 years with brother history polyps 6. Skin cancer screening- saw dermatology and had basal cell removed and follow up in a few weeks. advised regular sunscreen use. Denies worrisome, changing, or new skin lesions.  7. Smoking associated screening (lung cancer screening, AAA screen 65-75, UA)- never smoker 8. STD screening - only active with wife  Status of chronic or acute concerns   #social update- moving to the [place] and will meet with doctor there for prescriptions to get locally. 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 Veryl Gottron in the past -no chest pain or shortness of breath noted- still running  Lab Results   Component Value Date   CHOL 144 08/09/2022   HDL 59 08/09/2022   LDLCALC 70 08/09/2022   LDLDIRECT 138.2 09/24/2009   TRIG 80 08/09/2022   CHOLHDL 2.4 08/09/2022  A/P: lipids at goal last year- update today Nonobstructive coronary artery disease noted- Low Density Lipoprotein (LDL cholesterol) at goal and continue aspirin   -if he needs me to write for 360 days for either of 2 medications I'm open but he will likely get down in the [place]  #history of prostate cancer- 10/22/15 robotic prostatectomy [place] doctor. We monitor PSA   - on tadalafil 5 mg daily to help with incontinence through Dr. Derrick Fling in the past and now on Gemtesa- the change has been helpful he reports- concern was morebladder capacity issues. Some mild nausea and headache(s) with that but tolerable. Certain positions trigger nausea too. Urge to urinate with running but wears pad to be cautious and  tries to pee before.   #Immunizations- hepatitis a and hepatitis b status over 57 years old - and with travel we want to be cautious- status unknown- will check antibodies. Will likely do COVID and flu in the fall in the [place]  #advanced directives- he reevied message on this and is looking into this currently  Recommended follow up: Return in about 1 year (around 08/28/2024) for followup or sooner if needed.Schedule b4 you leave. Future Appointments  Date Time Provider Department Center  09/05/2023  8:00 AM [facility] ANNUAL WELLNESS VISIT 1 [facility]   Lab/Order associations: fasting   ICD-10-CM   1. Preventative health care  Z00.00     2. Hyperlipidemia, unspecified hyperlipidemia type  E78.5 Comprehensive metabolic panel with GFR    CBC with Differential/Platelet    Lipid panel    3. History of prostate cancer  Z85.46 PSA    4. Drug-induced myopathy  G72.0     5. Hepatitis A vaccination not up to date  Z28.39 Hepatitis A antibody, total    6. Hepatitis B vaccination status unknown  Z78.9  Hepatitis B surface antibody,qualitative      No orders of the defined types were placed in this encounter.   Return precautions advised.  Clarisa Crooked, MD

## 2023-08-30 ENCOUNTER — Ambulatory Visit: Payer: Self-pay | Admitting: Family Medicine

## 2023-08-30 DIAGNOSIS — Z23 Encounter for immunization: Secondary | ICD-10-CM

## 2023-08-30 LAB — LIPID PANEL
Cholesterol: 154 mg/dL (ref <200)
HDL: 61 mg/dL (ref >=40)
LDL Cholesterol (Calc): 74 mg/dL
Non-HDL Cholesterol (Calc): 93 mg/dL (ref <130)
Total CHOL/HDL Ratio: 2.5 (calc) (ref <5.0)
Triglycerides: 105 mg/dL (ref <150)

## 2023-08-30 LAB — CBC WITH DIFFERENTIAL/PLATELET
Absolute Lymphocytes: 1011 {cells}/uL (ref 850–3900)
Absolute Monocytes: 400 {cells}/uL (ref 200–950)
Basophils Absolute: 60 {cells}/uL (ref 0–200)
Basophils Relative: 1.4 %
Eosinophils Absolute: 228 {cells}/uL (ref 15–500)
Eosinophils Relative: 5.3 %
HCT: 49.5 % (ref 38.5–50.0)
Hemoglobin: 15.7 g/dL (ref 13.2–17.1)
MCH: 32 pg (ref 27.0–33.0)
MCHC: 31.7 g/dL — ABNORMAL LOW (ref 32.0–36.0)
MCV: 100.8 fL — ABNORMAL HIGH (ref 80.0–100.0)
MPV: 9.6 fL (ref 7.5–12.5)
Monocytes Relative: 9.3 %
Neutro Abs: 2602 {cells}/uL (ref 1500–7800)
Neutrophils Relative %: 60.5 %
Platelets: 233 10*3/uL (ref 140–400)
RBC: 4.91 10*6/uL (ref 4.20–5.80)
RDW: 12.6 % (ref 11.0–15.0)
Total Lymphocyte: 23.5 %
WBC: 4.3 10*3/uL (ref 3.8–10.8)

## 2023-08-30 LAB — COMPREHENSIVE METABOLIC PANEL WITH GFR
AG Ratio: 2.1 (calc) (ref 1.0–2.5)
ALT: 21 U/L (ref 9–46)
AST: 20 U/L (ref 10–35)
Albumin: 4.5 g/dL (ref 3.6–5.1)
Alkaline phosphatase (APISO): 49 U/L (ref 35–144)
BUN: 19 mg/dL (ref 7–25)
CO2: 28 mmol/L (ref 20–32)
Calcium: 9.6 mg/dL (ref 8.6–10.3)
Chloride: 104 mmol/L (ref 98–110)
Creat: 0.93 mg/dL (ref 0.70–1.35)
Globulin: 2.1 g/dL (ref 1.9–3.7)
Glucose, Bld: 90 mg/dL (ref 65–99)
Potassium: 4.7 mmol/L (ref 3.5–5.3)
Sodium: 142 mmol/L (ref 135–146)
Total Bilirubin: 0.6 mg/dL (ref 0.2–1.2)
Total Protein: 6.6 g/dL (ref 6.1–8.1)
eGFR: 91 mL/min/{1.73_m2} (ref >=60)

## 2023-08-30 LAB — HEPATITIS B SURFACE ANTIBODY,QUALITATIVE: Hep B S Ab: NONREACTIVE

## 2023-08-30 LAB — HEPATITIS A ANTIBODY, TOTAL: Hepatitis A AB,Total: REACTIVE — AB

## 2023-08-30 LAB — PSA: PSA: 0.05 ng/mL (ref <=4.00)

## 2023-09-05 ENCOUNTER — Ambulatory Visit

## 2023-09-05 ENCOUNTER — Telehealth: Payer: Self-pay

## 2023-09-05 VITALS — Ht 69.0 in | Wt 142.0 lb

## 2023-09-05 DIAGNOSIS — Z0000 Encounter for general adult medical examination without abnormal findings: Secondary | ICD-10-CM

## 2023-09-05 NOTE — Telephone Encounter (Signed)
 Called and spoke with pt and made him aware of below message.

## 2023-09-05 NOTE — Progress Notes (Signed)
 Subjective:   Ronnie Hawkins is a 68 y.o. who presents for a Medicare Wellness preventive visit.  As a reminder, Annual Wellness Visits don't include a physical exam, and some assessments may be limited, especially if this visit is performed virtually. We may recommend an in-person follow-up visit with your provider if needed.  Visit Complete: Virtual I connected with  Ronnie Hawkins on 09/05/23 by a audio enabled telemedicine application and verified that I am speaking with the correct person using two identifiers.  Patient Location: Home  Provider Location: Office/Clinic  I discussed the limitations of evaluation and management by telemedicine. The patient expressed understanding and agreed to proceed.  Vital Signs: Because this visit was a virtual/telehealth visit, some criteria may be missing or patient reported. Any vitals not documented were not able to be obtained and vitals that have been documented are patient reported.  VideoDeclined- This patient declined Librarian, academic. Therefore the visit was completed with audio only.  Persons Participating in Visit: Patient.  AWV Questionnaire: Yes: Patient Medicare AWV questionnaire was completed by the patient on 09/01/23; I have confirmed that all information answered by patient is correct and no changes since this date.  Cardiac Risk Factors include: advanced age (>54men, >75 women);dyslipidemia;male gender     Objective:    Today's Vitals   09/05/23 0803  Weight: 142 lb (64.4 kg)  Height: 5' 9 (1.753 m)   Body mass index is 20.97 kg/m.     09/05/2023    8:07 AM 08/14/2022    4:29 PM 08/02/2018   12:40 PM  Advanced Directives  Does Patient Have a Medical Advance Directive? No No No  Would patient like information on creating a medical advance directive? No - Patient declined No - Patient declined No - Patient declined      Data saved with a previous flowsheet row definition    Current  Medications (verified) Outpatient Encounter Medications as of 09/05/2023  Medication Sig   aspirin  EC 81 MG tablet Take 1 tablet (81 mg total) by mouth daily. Swallow whole.   cyanocobalamin (VITAMIN B12) 1000 MCG tablet    Multiple Vitamin (MULTI-VITAMINS) TABS Take 1 tablet by mouth daily.   Na Sulfate-K Sulfate-Mg Sulfate concentrate (SUPREP) 17.5-3.13-1.6 GM/177ML SOLN Take 177 mLs by mouth.   rosuvastatin  (CRESTOR ) 20 MG tablet TAKE 1 TABLET BY MOUTH EVERY DAY   Vibegron (GEMTESA) 75 MG TABS Take 75 mg by mouth daily.   No facility-administered encounter medications on file as of 09/05/2023.    Allergies (verified) Patient has no known allergies.   History: Past Medical History:  Diagnosis Date   Hyperlipidemia 04/23/2014   Paget disease of bone 02/26/2019   Tonsillar cyst    Eval by Dr. Ethyl of ENT   Past Surgical History:  Procedure Laterality Date   EYE SURGERY     laser surgery for retina   VASECTOMY     Family History  Problem Relation Age of Onset   Cancer Mother        breast CA   Diabetes Mother    Cancer Father 27       liver- alcohol related   Heart attack Father 94   Diabetes Father    Diabetes Brother    CAD Brother        too long for stents   Social History   Socioeconomic History   Marital status: Married    Spouse name: Montie   Number of children: 2  Years of education: Not on file   Highest education level: Master's degree (e.g., MA, MS, MEng, MEd, MSW, MBA)  Occupational History    Employer: [employer]  Tobacco Use   Smoking status: Never   Smokeless tobacco: Never  Vaping Use   Vaping status: Never Used  Substance and Sexual Activity   Alcohol use: No    Alcohol/week: 0.0 standard drinks of alcohol   Drug use: No   Sexual activity: Not on file  Other Topics Concern   Not on file  Social History Narrative   Married. 2 sons - younger son in [place], oldest in August. no grandkids yet. No pets.       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 Masters in teaching  in 2016. Teaching at [employer] in spring 2024 but may teach at [employer]	[place] [employer]   -has taught across the [place] as well! Back at it in 2025- [place]      Hobbies: running and reading       Patient is right-handed. He lives in a 2 level home with his wife.   Social Drivers of Corporate investment banker Strain: Low Risk  (09/01/2023)   Overall Financial Resource Strain (CARDIA)    Difficulty of Paying Living Expenses: Not hard at all  Food Insecurity: No Food Insecurity (09/01/2023)   Hunger Vital Sign    Worried About Running Out of Food in the Last Year: Never true    Ran Out of Food in the Last Year: Never true  Transportation Needs: No Transportation Needs (09/01/2023)   PRAPARE - Administrator, Civil Service (Medical): No    Lack of Transportation (Non-Medical): No  Physical Activity: Sufficiently Active (09/01/2023)   Exercise Vital Sign    Days of Exercise per Week: 6 days    Minutes of Exercise per Session: 60 min  Stress: No Stress Concern Present (09/01/2023)   [employer] of Occupational Health - Occupational Stress Questionnaire    Feeling of Stress: Not at all  Social Connections: Socially Integrated (09/01/2023)   Social Connection and Isolation Panel    Frequency of Communication with Friends and Family: More than three times a week    Frequency of Social Gatherings with Friends and Family: Twice a week    Attends Religious Services: More than 4 times per year    Active Member of [employer] or Organizations: Yes    Attends Engineer, structural: More than 4 times per year    Marital Status: Married    Tobacco Counseling Counseling given: Not Answered    Clinical Intake:  Pre-visit preparation completed: Yes  Pain : No/denies pain     BMI - recorded: 20.97 Nutritional Status: BMI of 19-24  Normal Nutritional Risks: None Diabetes: No  No results found for: HGBA1C   How often do  you need to have someone help you when you read instructions, pamphlets, or other written materials from your doctor or pharmacy?: 1 - Never  Interpreter Needed?: No  Information entered by :: Ellouise Haws, LPN   Activities of Daily Living     09/01/2023    8:55 AM  In your present state of health, do you have any difficulty performing the following activities:  Hearing? 0  Vision? 0  Difficulty concentrating or making decisions? 0  Walking or climbing stairs? 0  Dressing or bathing? 0  Doing errands, shopping? 0  Preparing Food and eating ? N  Using the Toilet? N  In the past six months, have you accidently leaked urine? Y  Comment wears a pad  Do you have problems with loss of bowel control? N  Managing your Medications? N  Managing your Finances? N  Housekeeping or managing your Housekeeping? N    Patient Care Team: Katrinka Garnette KIDD, MD as PCP - General (Family Medicine) Lonni Slain, MD as PCP - Cardiology (Cardiology) Skeet Juliene SAUNDERS, DO as Consulting Physician (Neurology)  I have updated your Care Teams any recent Medical Services you may have received from other providers in the past year.     Assessment:   This is a routine wellness examination for [employer].  Hearing/Vision screen Hearing Screening - Comments:: Pt denies any hearing issues  Vision Screening - Comments:: Wears rx glasses - up to date with routine eye exams with Dr debarah     Goals Addressed             This Visit's Progress    Patient Stated       Maintain health and activity        Depression Screen     09/05/2023    8:06 AM 08/29/2023    8:07 AM 07/25/2022    2:21 PM 09/19/2021    8:05 AM 09/15/2020   11:26 AM 06/20/2019    4:00 PM 05/16/2018    3:54 PM  PHQ 2/9 Scores  PHQ - 2 Score 0 0 0 0 0 0 0  PHQ- 9 Score 0 0  0       Fall Risk     09/01/2023    8:55 AM 07/25/2022    2:07 PM 01/24/2022    1:58 PM 09/15/2020   11:26 AM 08/02/2018   12:40 PM  Fall Risk   Falls in the  past year? 0 0 0 0 0   Number falls in past yr: 0 0 0 0   Injury with Fall? 0 0 0 0   Risk for fall due to : No Fall Risks No Fall Risks No Fall Risks No Fall Risks   Follow up Falls prevention discussed Falls evaluation completed Falls prevention discussed  Falls evaluation completed  Falls evaluation completed      Data saved with a previous flowsheet row definition    MEDICARE RISK AT HOME:  Medicare Risk at Home Any stairs in or around the home?: (Patient-Rptd) Yes If so, are there any without handrails?: (Patient-Rptd) No Home free of loose throw rugs in walkways, pet beds, electrical cords, etc?: (Patient-Rptd) Yes Adequate lighting in your home to reduce risk of falls?: (Patient-Rptd) Yes Life alert?: (Patient-Rptd) No Use of a cane, walker or w/c?: (Patient-Rptd) No Grab bars in the bathroom?: (Patient-Rptd) No Shower chair or bench in shower?: (Patient-Rptd) No Elevated toilet seat or a handicapped toilet?: (Patient-Rptd) No  TIMED UP AND GO:  Was the test performed?  No  Cognitive Function: 6CIT completed        09/05/2023    8:09 AM  6CIT Screen  What Year? 0 points  What month? 0 points  What time? 0 points  Count back from 20 0 points  Months in reverse 0 points  Repeat phrase 0 points  Total Score 0 points    Immunizations Immunization History  Administered Date(s) Administered   Fluad Quad(high Dose 65+) 11/27/2021   Hepatitis A 02/14/1996, 02/23/1997   Hepatitis A, Adult 02/14/1996, 02/23/1997   Hepatitis B 02/14/1996, 08/17/1996, 02/23/1997   Influenza, High Dose Seasonal PF 12/18/2022  Influenza,inj,Quad PF,6+ Mos 12/10/2014   Influenza-Unspecified 12/11/2012, 12/29/2013, 12/13/2015, 12/28/2016, 12/17/2017, 12/12/2018   MMR 09/28/1987   Moderna Covid-19 Fall Seasonal Vaccine 52yrs & older 12/12/2021, 12/18/2022   Moderna SARS-COV2 Booster Vaccination 12/12/2021   Moderna Sars-Covid-2 Vaccination 05/12/2019, 06/10/2019, 02/03/2020, 09/03/2020    PNEUMOCOCCAL CONJUGATE-20 01/24/2022   PPD Test 05/06/2014, 08/23/2022   Respiratory Syncytial Virus Vaccine,Recomb Aduvanted(Arexvy) 11/27/2021   Rsv, Bivalent, Protein Subunit Rsvpref,pf Marlow) 11/28/2021   Td 07/08/2008   Tdap 08/15/2018   Typhoid Inactivated 08/22/2016, 07/17/2019, 10/16/2022   Zoster Recombinant(Shingrix) 09/20/2022, 12/18/2022    Screening Tests Health Maintenance  Topic Date Due   COVID-19 Vaccine (6 - Moderna risk 2024-25 season) 09/14/2023 (Originally 06/18/2023)   Hepatitis C Screening  03/09/2098 (Originally 11/20/1973)   INFLUENZA VACCINE  10/12/2023   Medicare Annual Wellness (AWV)  09/04/2024   Colonoscopy  10/03/2027   DTaP/Tdap/Td (3 - Td or Tdap) 08/14/2028   Pneumococcal Vaccine: 50+ Years  Completed   Hepatitis B Vaccines  Completed   Zoster Vaccines- Shingrix  Completed   HPV VACCINES  Aged Out   Meningococcal B Vaccine  Aged Out    Health Maintenance  There are no preventive care reminders to display for this patient.  Health Maintenance Items Addressed: See Nurse Notes at the end of this note  Additional Screening:  Vision Screening: Recommended annual ophthalmology exams for early detection of glaucoma and other disorders of the eye. Would you like a referral to an eye doctor? No    Dental Screening: Recommended annual dental exams for proper oral hygiene  Community Resource Referral / Chronic Care Management: CRR required this visit?  No   CCM required this visit?  No   Plan:    I have personally reviewed and noted the following in the patient's chart:   Medical and social history Use of alcohol, tobacco or illicit drugs  Current medications and supplements including opioid prescriptions. Patient is not currently taking opioid prescriptions. Functional ability and status Nutritional status Physical activity Advanced directives List of other physicians Hospitalizations, surgeries, and ER visits in previous 12  months Vitals Screenings to include cognitive, depression, and falls Referrals and appointments  In addition, I have reviewed and discussed with patient certain preventive protocols, quality metrics, and best practice recommendations. A written personalized care plan for preventive services as well as general preventive health recommendations were provided to patient.   Ellouise VEAR Haws, LPN   [id number]/74/7974   After Visit Summary: (MyChart) Due to this being a telephonic visit, the after visit summary with patients personalized plan was offered to patient via MyChart   Notes: Nothing significant to report at this time.

## 2023-09-05 NOTE — Telephone Encounter (Signed)
 See below.  Copied from CRM [id number]. Topic: Clinical - Medication Prior Auth >> Sep 05, 2023 10:11 AM Chasity T wrote: Reason for CRM: Patient is calling in for prior auth for Hep B shot. He states he needs it done today because he is going out of town after 12 pm today. Please contact him for further instructions on the shot

## 2023-09-05 NOTE — Patient Instructions (Signed)
 Mr. Ronnie Hawkins , Thank you for taking time out of your busy schedule to complete your Annual Wellness Visit with me. I enjoyed our conversation and look forward to speaking with you again next year. I, as well as your care team,  appreciate your ongoing commitment to your health goals. Please review the following plan we discussed and let me know if I can assist you in the future. Your Game plan/ To Do List    Referrals: If you haven't heard from the office you've been referred to, please reach out to them at the phone provided.   Follow up Visits: Next Medicare AWV with our clinical staff: 09/11/24   Have you seen your provider in the last 6 months (3 months if uncontrolled diabetes)? Yes Next Office Visit with your provider: 09/05/24  Clinician Recommendations:  Aim for 30 minutes of exercise or brisk walking, 6-8 glasses of water, and 5 servings of fruits and vegetables each day.        This is a list of the screening recommended for you and due dates:  Health Maintenance  Topic Date Due   COVID-19 Vaccine (6 - Moderna risk 2024-25 season) 09/14/2023*   Hepatitis C Screening  03/09/2098*   Flu Shot  10/12/2023   Medicare Annual Wellness Visit  09/04/2024   Colon Cancer Screening  10/03/2027   DTaP/Tdap/Td vaccine (3 - Td or Tdap) 08/14/2028   Pneumococcal Vaccine for age over 57  Completed   Hepatitis B Vaccine  Completed   Zoster (Shingles) Vaccine  Completed   HPV Vaccine  Aged Out   Meningitis B Vaccine  Aged Out  *Topic was postponed. The date shown is not the original due date.    Advanced directives: (Declined) Advance directive discussed with you today. Even though you declined this today, please call our office should you change your mind, and we can give you the proper paperwork for you to fill out. Advance Care Planning is important because it:  [x]  Makes sure you receive the medical care that is consistent with your values, goals, and preferences  [x]  It provides  guidance to your family and loved ones and reduces their decisional burden about whether or not they are making the right decisions based on your wishes.  Follow the link provided in your after visit summary or read over the paperwork we have mailed to you to help you started getting your Advance Directives in place. If you need assistance in completing these, please reach out to us  so that we can help you!  See attachments for Preventive Care and Fall Prevention Tips.

## 2024-04-04 ENCOUNTER — Telehealth: Payer: Self-pay | Admitting: Family Medicine

## 2024-04-04 NOTE — Telephone Encounter (Signed)
 LVM to reschedule pt's 09/05/2024 office visit as the Provider will no longer be in office that day. Cancelled existing appt

## 2024-09-05 ENCOUNTER — Ambulatory Visit: Admitting: Family Medicine

## 2024-09-11 ENCOUNTER — Ambulatory Visit
